# Patient Record
Sex: Female | Born: 1935 | Race: Asian | Marital: Single
Health system: Southern US, Community
[De-identification: ages and names within clinical notes are randomized; demographics above are authoritative.]

---

## 2014-05-06 ENCOUNTER — Inpatient Hospital Stay
Admit: 2014-05-06 | Disposition: A | Discharge status: Home / Self Care | Payer: Self-pay | Attending: Internal Medicine | Admitting: Internal Medicine

## 2014-05-06 DIAGNOSIS — I340 Nonrheumatic mitral (valve) insufficiency: Secondary | ICD-10-CM

## 2014-05-06 LAB — CBC
HCT: 28.2 % — AB (ref 35.0–47.0)
HGB: 8.6 g/dL — AB (ref 12.0–16.0)
MCH: 32.1 pg (ref 26.0–34.0)
MCHC: 30.3 g/dL — AB (ref 32.0–36.0)
MCV: 106 fL — ABNORMAL HIGH (ref 80–100)
Platelet: 129 10*3/uL — ABNORMAL LOW (ref 150–440)
RBC: 2.67 10*6/uL — ABNORMAL LOW (ref 3.80–5.20)
RDW: 17.7 % — ABNORMAL HIGH (ref 11.5–14.5)
WBC: 5.4 10*3/uL (ref 3.6–11.0)

## 2014-05-06 LAB — CK TOTAL AND CKMB (NOT AT ARMC)
CK, TOTAL: 99 U/L
CK-MB: 7.4 ng/mL — AB

## 2014-05-06 LAB — IRON AND TIBC
IRON SATURATION: 22.9
Iron Bind.Cap.(Total): 210 — ABNORMAL LOW (ref 250–450)
Iron: 48 ug/dL
Unbound Iron-Bind.Cap.: 161.7

## 2014-05-06 LAB — BASIC METABOLIC PANEL
ANION GAP: 4 — AB (ref 7–16)
BUN: 55 mg/dL — AB
CO2: 18 mmol/L — AB
Calcium, Total: 7.9 mg/dL — ABNORMAL LOW
Chloride: 119 mmol/L — ABNORMAL HIGH
Creatinine: 4.16 mg/dL — ABNORMAL HIGH
EGFR (Non-African Amer.): 9 — ABNORMAL LOW
GFR CALC AF AMER: 10 — AB
Glucose: 136 mg/dL — ABNORMAL HIGH
Potassium: 6.3 mmol/L — ABNORMAL HIGH
Sodium: 141 mmol/L

## 2014-05-06 LAB — PROTIME-INR
INR: 1.1
PROTHROMBIN TIME: 14.7 s

## 2014-05-06 LAB — PHOSPHORUS: Phosphorus: 4.3 mg/dL

## 2014-05-06 LAB — CK-MB: CK-MB: 8 ng/mL — AB

## 2014-05-06 LAB — FERRITIN: FERRITIN (ARMC): 79 ng/mL

## 2014-05-06 LAB — PRO B NATRIURETIC PEPTIDE: B-Type Natriuretic Peptide: 4269 pg/mL — ABNORMAL HIGH

## 2014-05-06 LAB — TROPONIN I
Troponin-I: 0.06 ng/mL — ABNORMAL HIGH
Troponin-I: 0.06 ng/mL — ABNORMAL HIGH

## 2014-05-06 LAB — POTASSIUM: POTASSIUM: 5.5 mmol/L — AB

## 2014-05-07 LAB — PROTEIN / CREATININE RATIO, URINE
Creatinine, Urine Random: 134 mg/dL (ref 30–125)
Protein, Urine: <6 mg/dL — ABNORMAL LOW (ref 0–9)

## 2014-05-07 LAB — HEPATIC FUNCTION PANEL A (ARMC)
ALK PHOS: 40 U/L
ALT: 9 U/L — AB
AST: 19 U/L
Albumin: 2.4 g/dL — ABNORMAL LOW
Bilirubin, Direct: <0.1 mg/dL
Bilirubin,Total: <0.1 mg/dL — ABNORMAL LOW
TOTAL PROTEIN: 5.6 g/dL — AB

## 2014-05-07 LAB — RENAL FUNCTION PANEL
ALBUMIN: 2.3 g/dL — AB
ANION GAP: 4 — AB (ref 7–16)
BUN: 43 mg/dL — ABNORMAL HIGH
CALCIUM: 7.7 mg/dL — AB
CHLORIDE: 117 mmol/L — AB
Co2: 22 mmol/L
Creatinine: 3.51 mg/dL — ABNORMAL HIGH
EGFR (Non-African Amer.): 11 — ABNORMAL LOW
GFR CALC AF AMER: 13 — AB
Glucose: 83 mg/dL
PHOSPHORUS: 4.2 mg/dL
POTASSIUM: 4.8 mmol/L
Sodium: 143 mmol/L

## 2014-05-07 LAB — URINALYSIS, COMPLETE
Bilirubin,UR: NEGATIVE
Blood: NEGATIVE
Glucose,UR: 50 mg/dL (ref 0–75)
KETONE: NEGATIVE
Leukocyte Esterase: NEGATIVE
Nitrite: NEGATIVE
Ph: 5 (ref 4.5–8.0)
Protein: >=500
SPECIFIC GRAVITY: 1.014 (ref 1.003–1.030)

## 2014-05-07 LAB — CBC WITH DIFFERENTIAL/PLATELET
BASOS PCT: 0.6 %
Basophil #: 0 10*3/uL (ref 0.0–0.1)
Eosinophil #: 0 10*3/uL (ref 0.0–0.7)
Eosinophil %: 0.3 %
HCT: 23.9 % — ABNORMAL LOW (ref 35.0–47.0)
HGB: 7.5 g/dL — AB (ref 12.0–16.0)
LYMPHS ABS: 1.5 10*3/uL (ref 1.0–3.6)
Lymphocyte %: 30.9 %
MCH: 32 pg (ref 26.0–34.0)
MCHC: 31.3 g/dL — AB (ref 32.0–36.0)
MCV: 102 fL — ABNORMAL HIGH (ref 80–100)
MONO ABS: 0.4 x10 3/mm (ref 0.2–0.9)
Monocyte %: 7.2 %
NEUTROS ABS: 3.1 10*3/uL (ref 1.4–6.5)
NEUTROS PCT: 61 %
Platelet: 76 10*3/uL — ABNORMAL LOW (ref 150–440)
RBC: 2.33 10*6/uL — ABNORMAL LOW (ref 3.80–5.20)
RDW: 17.6 % — ABNORMAL HIGH (ref 11.5–14.5)
WBC: 5 10*3/uL (ref 3.6–11.0)

## 2014-05-07 LAB — CK TOTAL AND CKMB (NOT AT ARMC)
CK, Total: 69 U/L
CK-MB: 7.9 ng/mL — AB

## 2014-05-07 LAB — PROTEIN ELECTROPHORESIS(ARMC)

## 2014-05-07 LAB — TROPONIN I: Troponin-I: 0.08 ng/mL — ABNORMAL HIGH

## 2014-05-07 LAB — KAPPA/LAMBDA FREE LIGHT CHAINS (ARMC)

## 2014-05-08 LAB — RENAL FUNCTION PANEL
ANION GAP: 4 — AB (ref 7–16)
Albumin: 2.3 g/dL — ABNORMAL LOW
BUN: 35 mg/dL — ABNORMAL HIGH
CALCIUM: 7.3 mg/dL — AB
Chloride: 107 mmol/L
Co2: 28 mmol/L
Creatinine: 3.4 mg/dL — ABNORMAL HIGH
GFR CALC AF AMER: 13 — AB
GFR CALC NON AF AMER: 12 — AB
Glucose: 135 mg/dL — ABNORMAL HIGH
Phosphorus: 3.3 mg/dL
Potassium: 4.8 mmol/L
SODIUM: 139 mmol/L

## 2014-05-08 LAB — URINE CULTURE

## 2014-05-08 LAB — CBC WITH DIFFERENTIAL/PLATELET
BASOS ABS: 0 10*3/uL (ref 0.0–0.1)
Basophil %: 0.6 %
EOS ABS: 0 10*3/uL (ref 0.0–0.7)
EOS PCT: 0.5 %
HCT: 20.2 % — AB (ref 35.0–47.0)
HGB: 6.3 g/dL — ABNORMAL LOW (ref 12.0–16.0)
Lymphocyte #: 1.1 10*3/uL (ref 1.0–3.6)
Lymphocyte %: 26 %
MCH: 31.9 pg (ref 26.0–34.0)
MCHC: 31 g/dL — ABNORMAL LOW (ref 32.0–36.0)
MCV: 103 fL — ABNORMAL HIGH (ref 80–100)
Monocyte #: 0.4 x10 3/mm (ref 0.2–0.9)
Monocyte %: 8.8 %
NEUTROS PCT: 64.1 %
Neutrophil #: 2.8 10*3/uL (ref 1.4–6.5)
PLATELETS: 54 10*3/uL — AB (ref 150–440)
RBC: 1.96 10*6/uL — ABNORMAL LOW (ref 3.80–5.20)
RDW: 16.7 % — AB (ref 11.5–14.5)
WBC: 4.4 10*3/uL (ref 3.6–11.0)

## 2014-05-09 LAB — CBC WITH DIFFERENTIAL/PLATELET
BASOS ABS: 0 10*3/uL (ref 0.0–0.1)
Basophil %: 0.7 %
EOS ABS: 0.1 10*3/uL (ref 0.0–0.7)
EOS PCT: 1.3 %
HCT: 20.3 % — AB (ref 35.0–47.0)
HGB: 6.5 g/dL — ABNORMAL LOW (ref 12.0–16.0)
LYMPHS ABS: 2.1 10*3/uL (ref 1.0–3.6)
Lymphocyte %: 37.8 %
MCH: 32.7 pg (ref 26.0–34.0)
MCHC: 32.1 g/dL (ref 32.0–36.0)
MCV: 102 fL — AB (ref 80–100)
MONO ABS: 0.6 x10 3/mm (ref 0.2–0.9)
Monocyte %: 11.1 %
Neutrophil #: 2.8 10*3/uL (ref 1.4–6.5)
Neutrophil %: 49.1 %
PLATELETS: 49 10*3/uL — AB (ref 150–440)
RBC: 1.99 10*6/uL — ABNORMAL LOW (ref 3.80–5.20)
RDW: 16.6 % — AB (ref 11.5–14.5)
WBC: 5.6 10*3/uL (ref 3.6–11.0)

## 2014-05-09 LAB — BASIC METABOLIC PANEL
ANION GAP: 3 — AB (ref 7–16)
BUN: 21 mg/dL — ABNORMAL HIGH
CHLORIDE: 104 mmol/L
CREATININE: 2.53 mg/dL — AB
Calcium, Total: 7.3 mg/dL — ABNORMAL LOW
Co2: 31 mmol/L
EGFR (African American): 19 — ABNORMAL LOW
GFR CALC NON AF AMER: 16 — AB
GLUCOSE: 90 mg/dL
POTASSIUM: 4.2 mmol/L
Sodium: 138 mmol/L

## 2014-05-10 ENCOUNTER — Other Ambulatory Visit: Payer: Self-pay | Admitting: Physician Assistant

## 2014-05-10 LAB — CBC WITH DIFFERENTIAL/PLATELET
BASOS PCT: 0.5 %
Basophil #: 0 10*3/uL (ref 0.0–0.1)
EOS PCT: 1.7 %
Eosinophil #: 0.1 10*3/uL (ref 0.0–0.7)
HCT: 25.9 % — ABNORMAL LOW (ref 35.0–47.0)
HGB: 8.2 g/dL — AB (ref 12.0–16.0)
Lymphocyte #: 1.5 10*3/uL (ref 1.0–3.6)
Lymphocyte %: 24.9 %
MCH: 31.6 pg (ref 26.0–34.0)
MCHC: 31.7 g/dL — ABNORMAL LOW (ref 32.0–36.0)
MCV: 99 fL (ref 80–100)
MONO ABS: 0.6 x10 3/mm (ref 0.2–0.9)
Monocyte %: 10.2 %
NEUTROS ABS: 3.7 10*3/uL (ref 1.4–6.5)
Neutrophil %: 62.7 %
Platelet: 59 10*3/uL — ABNORMAL LOW (ref 150–440)
RBC: 2.6 10*6/uL — AB (ref 3.80–5.20)
RDW: 18.3 % — ABNORMAL HIGH (ref 11.5–14.5)
WBC: 5.9 10*3/uL (ref 3.6–11.0)

## 2014-05-10 LAB — BASIC METABOLIC PANEL
Anion Gap: 3 — ABNORMAL LOW (ref 7–16)
BUN: 33 mg/dL — AB
CHLORIDE: 100 mmol/L — AB
CREATININE: 3.51 mg/dL — AB
Calcium, Total: 7.5 mg/dL — ABNORMAL LOW
Co2: 31 mmol/L
EGFR (African American): 13 — ABNORMAL LOW
EGFR (Non-African Amer.): 11 — ABNORMAL LOW
GLUCOSE: 97 mg/dL
Potassium: 4.3 mmol/L
SODIUM: 134 mmol/L — AB

## 2014-05-10 LAB — PHOSPHORUS: Phosphorus: 3.9 mg/dL

## 2014-05-11 DIAGNOSIS — I10 Essential (primary) hypertension: Secondary | ICD-10-CM

## 2014-05-11 DIAGNOSIS — I5043 Acute on chronic combined systolic (congestive) and diastolic (congestive) heart failure: Secondary | ICD-10-CM

## 2014-05-11 DIAGNOSIS — N186 End stage renal disease: Secondary | ICD-10-CM

## 2014-05-11 LAB — BASIC METABOLIC PANEL
ANION GAP: 3 — AB (ref 7–16)
BUN: 23 mg/dL — ABNORMAL HIGH
CALCIUM: 7.2 mg/dL — AB
CHLORIDE: 100 mmol/L — AB
Co2: 33 mmol/L — ABNORMAL HIGH
Creatinine: 2.71 mg/dL — ABNORMAL HIGH
EGFR (Non-African Amer.): 15 — ABNORMAL LOW
GFR CALC AF AMER: 18 — AB
Glucose: 97 mg/dL
Potassium: 4.2 mmol/L
Sodium: 136 mmol/L

## 2014-05-11 LAB — CBC WITH DIFFERENTIAL/PLATELET
Basophil #: 0 10*3/uL (ref 0.0–0.1)
Basophil %: 0.6 %
EOS PCT: 0.4 %
Eosinophil #: 0 10*3/uL (ref 0.0–0.7)
HCT: 23.7 % — ABNORMAL LOW (ref 35.0–47.0)
HGB: 7.6 g/dL — ABNORMAL LOW (ref 12.0–16.0)
LYMPHS ABS: 1.6 10*3/uL (ref 1.0–3.6)
Lymphocyte %: 22.6 %
MCH: 31.6 pg (ref 26.0–34.0)
MCHC: 32.2 g/dL (ref 32.0–36.0)
MCV: 98 fL (ref 80–100)
MONO ABS: 0.8 x10 3/mm (ref 0.2–0.9)
Monocyte %: 11.1 %
NEUTROS PCT: 65.3 %
Neutrophil #: 4.7 10*3/uL (ref 1.4–6.5)
Platelet: 56 10*3/uL — ABNORMAL LOW (ref 150–440)
RBC: 2.41 10*6/uL — AB (ref 3.80–5.20)
RDW: 17.4 % — ABNORMAL HIGH (ref 11.5–14.5)
WBC: 7.2 10*3/uL (ref 3.6–11.0)

## 2014-05-11 LAB — PROTEIN, BODY FLUID

## 2014-05-11 LAB — AMYLASE, BODY FLUID: Amylase, Body Fluid: 42 U/L

## 2014-05-11 LAB — UR PROT ELECTROPHORESIS, URINE RANDOM

## 2014-05-11 LAB — GLUCOSE, SEROUS FLUID: Glucose, Body Fluid: 109 mg/dL

## 2014-05-11 LAB — ALBUMIN, FLUID (OTHER): BODY FLUID ALBUMIN: 1 g/dL

## 2014-05-11 LAB — LACTATE DEHYDROGENASE, PLEURAL OR PERITONEAL FLUID: LDH, Body Fluid: 58 U/L

## 2014-05-12 LAB — BODY FLUID CELL COUNT WITH DIFFERENTIAL
Basophil: 0 %
EOS PCT: 0 %
Lymphocytes: 17 %
NUCLEATED CELL COUNT: 79 /mm3
Neutrophils: 79 %
OTHER MONONUCLEAR CELLS: 4 %
Other Cells BF: 0 %

## 2014-05-12 LAB — BASIC METABOLIC PANEL
Anion Gap: 3 — ABNORMAL LOW (ref 7–16)
BUN: 30 mg/dL — ABNORMAL HIGH
CALCIUM: 7.3 mg/dL — AB
CHLORIDE: 100 mmol/L — AB
Co2: 31 mmol/L
Creatinine: 3.56 mg/dL — ABNORMAL HIGH
EGFR (African American): 13 — ABNORMAL LOW
GFR CALC NON AF AMER: 11 — AB
Glucose: 97 mg/dL
Potassium: 4.5 mmol/L
SODIUM: 134 mmol/L — AB

## 2014-05-12 LAB — CBC WITH DIFFERENTIAL/PLATELET
Basophil #: 0 10*3/uL (ref 0.0–0.1)
Basophil %: 0.4 %
EOS ABS: 0.1 10*3/uL (ref 0.0–0.7)
Eosinophil %: 1.3 %
HCT: 25.1 % — AB (ref 35.0–47.0)
HGB: 8 g/dL — ABNORMAL LOW (ref 12.0–16.0)
LYMPHS PCT: 20.3 %
Lymphocyte #: 1.3 10*3/uL (ref 1.0–3.6)
MCH: 32 pg (ref 26.0–34.0)
MCHC: 31.9 g/dL — AB (ref 32.0–36.0)
MCV: 100 fL (ref 80–100)
MONO ABS: 0.8 x10 3/mm (ref 0.2–0.9)
Monocyte %: 12.5 %
NEUTROS ABS: 4.1 10*3/uL (ref 1.4–6.5)
Neutrophil %: 65.5 %
Platelet: 64 10*3/uL — ABNORMAL LOW (ref 150–440)
RBC: 2.5 10*6/uL — ABNORMAL LOW (ref 3.80–5.20)
RDW: 17.1 % — AB (ref 11.5–14.5)
WBC: 6.2 10*3/uL (ref 3.6–11.0)

## 2014-05-12 LAB — PROTEIN, BODY FLUID

## 2014-05-12 LAB — LACTATE DEHYDROGENASE, PLEURAL OR PERITONEAL FLUID: LDH, Body Fluid: 118 U/L

## 2014-05-12 LAB — PHOSPHORUS: Phosphorus: 3.7 mg/dL

## 2014-05-13 LAB — BASIC METABOLIC PANEL
ANION GAP: 3 — AB (ref 7–16)
BUN: 17 mg/dL
CALCIUM: 7.4 mg/dL — AB
CHLORIDE: 101 mmol/L
Co2: 34 mmol/L — ABNORMAL HIGH
Creatinine: 2.26 mg/dL — ABNORMAL HIGH
EGFR (African American): 22 — ABNORMAL LOW
EGFR (Non-African Amer.): 19 — ABNORMAL LOW
Glucose: 95 mg/dL
Potassium: 4.1 mmol/L
Sodium: 138 mmol/L

## 2014-05-13 LAB — CBC WITH DIFFERENTIAL/PLATELET
BASOS PCT: 0.4 %
Basophil #: 0 10*3/uL (ref 0.0–0.1)
EOS ABS: 0.1 10*3/uL (ref 0.0–0.7)
Eosinophil %: 2.2 %
HCT: 24.6 % — ABNORMAL LOW (ref 35.0–47.0)
HGB: 7.8 g/dL — ABNORMAL LOW (ref 12.0–16.0)
Lymphocyte #: 1.1 10*3/uL (ref 1.0–3.6)
Lymphocyte %: 20.4 %
MCH: 31.9 pg (ref 26.0–34.0)
MCHC: 31.7 g/dL — ABNORMAL LOW (ref 32.0–36.0)
MCV: 101 fL — AB (ref 80–100)
Monocyte #: 0.8 x10 3/mm (ref 0.2–0.9)
Monocyte %: 13.9 %
NEUTROS ABS: 3.5 10*3/uL (ref 1.4–6.5)
Neutrophil %: 63.1 %
PLATELETS: 59 10*3/uL — AB (ref 150–440)
RBC: 2.45 10*6/uL — AB (ref 3.80–5.20)
RDW: 17.6 % — AB (ref 11.5–14.5)
WBC: 5.6 10*3/uL (ref 3.6–11.0)

## 2014-05-14 LAB — PHOSPHORUS: Phosphorus: 2.6 mg/dL

## 2014-05-15 LAB — BASIC METABOLIC PANEL
Anion Gap: 0 — ABNORMAL LOW (ref 7–16)
BUN: 17 mg/dL
CALCIUM: 7.3 mg/dL — AB
CO2: 35 mmol/L — AB
CREATININE: 2.44 mg/dL — AB
Chloride: 102 mmol/L
EGFR (African American): 20 — ABNORMAL LOW
EGFR (Non-African Amer.): 17 — ABNORMAL LOW
Glucose: 91 mg/dL
Potassium: 4.3 mmol/L
Sodium: 137 mmol/L

## 2014-05-15 LAB — CBC WITH DIFFERENTIAL/PLATELET
BASOS ABS: 0 10*3/uL (ref 0.0–0.1)
Basophil %: 0.6 %
Eosinophil #: 0.1 10*3/uL (ref 0.0–0.7)
Eosinophil %: 2 %
HCT: 25.4 % — ABNORMAL LOW (ref 35.0–47.0)
HGB: 8 g/dL — ABNORMAL LOW (ref 12.0–16.0)
LYMPHS PCT: 28.7 %
Lymphocyte #: 2 10*3/uL (ref 1.0–3.6)
MCH: 32.2 pg (ref 26.0–34.0)
MCHC: 31.6 g/dL — ABNORMAL LOW (ref 32.0–36.0)
MCV: 102 fL — ABNORMAL HIGH (ref 80–100)
MONOS PCT: 15 %
Monocyte #: 1 x10 3/mm — ABNORMAL HIGH (ref 0.2–0.9)
NEUTROS PCT: 53.7 %
Neutrophil #: 3.8 10*3/uL (ref 1.4–6.5)
PLATELETS: 58 10*3/uL — AB (ref 150–440)
RBC: 2.5 10*6/uL — ABNORMAL LOW (ref 3.80–5.20)
RDW: 17.2 % — AB (ref 11.5–14.5)
WBC: 7 10*3/uL (ref 3.6–11.0)

## 2014-05-15 LAB — MAGNESIUM: MAGNESIUM: 1.6 mg/dL — AB

## 2014-05-16 LAB — BODY FLUID CULTURE

## 2014-05-17 LAB — RENAL FUNCTION PANEL
ALBUMIN: 2.3 g/dL — AB
Anion Gap: 6 — ABNORMAL LOW (ref 7–16)
BUN: 39 mg/dL — ABNORMAL HIGH
CHLORIDE: 95 mmol/L — AB
CREATININE: 3.73 mg/dL — AB
Calcium, Total: 7.4 mg/dL — ABNORMAL LOW
Co2: 34 mmol/L — ABNORMAL HIGH
EGFR (African American): 12 — ABNORMAL LOW
GFR CALC NON AF AMER: 10 — AB
Glucose: 128 mg/dL — ABNORMAL HIGH
POTASSIUM: 4.9 mmol/L
Phosphorus: 3 mg/dL
Sodium: 135 mmol/L

## 2014-05-17 LAB — CBC WITH DIFFERENTIAL/PLATELET
BASOS ABS: 0 10*3/uL (ref 0.0–0.1)
Basophil %: 0.6 %
EOS ABS: 0.2 10*3/uL (ref 0.0–0.7)
Eosinophil %: 2.3 %
HCT: 24.1 % — ABNORMAL LOW (ref 35.0–47.0)
HGB: 7.8 g/dL — AB (ref 12.0–16.0)
LYMPHS ABS: 1.3 10*3/uL (ref 1.0–3.6)
Lymphocyte %: 18.3 %
MCH: 32.4 pg (ref 26.0–34.0)
MCHC: 32.2 g/dL (ref 32.0–36.0)
MCV: 101 fL — AB (ref 80–100)
MONO ABS: 1.1 x10 3/mm — AB (ref 0.2–0.9)
Monocyte %: 14.9 %
Neutrophil #: 4.6 10*3/uL (ref 1.4–6.5)
Neutrophil %: 63.9 %
Platelet: 95 10*3/uL — ABNORMAL LOW (ref 150–440)
RBC: 2.4 10*6/uL — ABNORMAL LOW (ref 3.80–5.20)
RDW: 17.1 % — AB (ref 11.5–14.5)
WBC: 7.2 10*3/uL (ref 3.6–11.0)

## 2014-05-19 LAB — RENAL FUNCTION PANEL
ALBUMIN: 2.3 g/dL — AB
Anion Gap: 2 — ABNORMAL LOW (ref 7–16)
BUN: 41 mg/dL — AB
CHLORIDE: 99 mmol/L — AB
CREATININE: 3.32 mg/dL — AB
Calcium, Total: 7.3 mg/dL — ABNORMAL LOW
Co2: 34 mmol/L — ABNORMAL HIGH
EGFR (African American): 14 — ABNORMAL LOW
GFR CALC NON AF AMER: 12 — AB
Glucose: 94 mg/dL
POTASSIUM: 4.5 mmol/L
Phosphorus: 2.2 mg/dL — ABNORMAL LOW
Sodium: 135 mmol/L

## 2014-05-19 LAB — HEMOGLOBIN: HGB: 7.3 g/dL — AB (ref 12.0–16.0)

## 2014-05-19 LAB — MAGNESIUM: Magnesium: 1.7 mg/dL

## 2014-05-20 LAB — HEMOGLOBIN: HGB: 7.8 g/dL — ABNORMAL LOW (ref 12.0–16.0)

## 2014-05-20 LAB — CREATININE CLEARANCE, URINE, 24 HOUR
COLLECTION HOURS: 24 h
CREATININE, URINE: 27 mg/dL — AB (ref 30.0–125.0)
Creatinine Clearance: 4 mL/min — ABNORMAL LOW (ref 80–130)
Creatinine, Serum: 4.04 mg/dL — ABNORMAL HIGH (ref 0.60–1.30)
Total Volume: 700 mL

## 2014-05-20 LAB — BASIC METABOLIC PANEL
Anion Gap: 6 — ABNORMAL LOW (ref 7–16)
BUN: 52 mg/dL — ABNORMAL HIGH
CHLORIDE: 96 mmol/L — AB
Calcium, Total: 7.7 mg/dL — ABNORMAL LOW
Co2: 33 mmol/L — ABNORMAL HIGH
Creatinine: 4.04 mg/dL — ABNORMAL HIGH
EGFR (African American): 11 — ABNORMAL LOW
EGFR (Non-African Amer.): 9 — ABNORMAL LOW
Glucose: 89 mg/dL
Potassium: 4.8 mmol/L
Sodium: 135 mmol/L

## 2014-05-22 LAB — RENAL FUNCTION PANEL
ALBUMIN: 2.6 g/dL — AB
ANION GAP: 6 — AB (ref 7–16)
BUN: 75 mg/dL — ABNORMAL HIGH
CALCIUM: 7.7 mg/dL — AB
CO2: 31 mmol/L
Chloride: 94 mmol/L — ABNORMAL LOW
Creatinine: 4.94 mg/dL — ABNORMAL HIGH
EGFR (African American): 9 — ABNORMAL LOW
EGFR (Non-African Amer.): 7 — ABNORMAL LOW
Glucose: 147 mg/dL — ABNORMAL HIGH
Phosphorus: 3.5 mg/dL
Potassium: 4.7 mmol/L
Sodium: 131 mmol/L — ABNORMAL LOW

## 2014-05-22 LAB — CBC WITH DIFFERENTIAL/PLATELET
BASOS ABS: 0 10*3/uL (ref 0.0–0.1)
BASOS PCT: 0.6 %
Eosinophil #: 0.2 10*3/uL (ref 0.0–0.7)
Eosinophil %: 2.9 %
HCT: 24.3 % — ABNORMAL LOW (ref 35.0–47.0)
HGB: 7.7 g/dL — ABNORMAL LOW (ref 12.0–16.0)
Lymphocyte #: 1.4 10*3/uL (ref 1.0–3.6)
Lymphocyte %: 19.6 %
MCH: 31.8 pg (ref 26.0–34.0)
MCHC: 31.8 g/dL — AB (ref 32.0–36.0)
MCV: 100 fL (ref 80–100)
Monocyte #: 0.7 x10 3/mm (ref 0.2–0.9)
Monocyte %: 10 %
NEUTROS PCT: 66.9 %
Neutrophil #: 4.8 10*3/uL (ref 1.4–6.5)
Platelet: 145 10*3/uL — ABNORMAL LOW (ref 150–440)
RBC: 2.43 10*6/uL — ABNORMAL LOW (ref 3.80–5.20)
RDW: 16.3 % — ABNORMAL HIGH (ref 11.5–14.5)
WBC: 7.1 10*3/uL (ref 3.6–11.0)

## 2014-05-25 LAB — PHOSPHORUS: Phosphorus: 6.2 mg/dL — ABNORMAL HIGH

## 2014-05-27 LAB — CBC WITH DIFFERENTIAL/PLATELET
BASOS ABS: 0.1 10*3/uL (ref 0.0–0.1)
Basophil %: 1 %
EOS ABS: 0.1 10*3/uL (ref 0.0–0.7)
Eosinophil %: 2 %
HCT: 21.7 % — ABNORMAL LOW (ref 35.0–47.0)
HGB: 7 g/dL — ABNORMAL LOW (ref 12.0–16.0)
LYMPHS PCT: 19.9 %
Lymphocyte #: 1.4 10*3/uL (ref 1.0–3.6)
MCH: 32.5 pg (ref 26.0–34.0)
MCHC: 32 g/dL (ref 32.0–36.0)
MCV: 102 fL — ABNORMAL HIGH (ref 80–100)
MONOS PCT: 11.7 %
Monocyte #: 0.8 x10 3/mm (ref 0.2–0.9)
NEUTROS PCT: 65.4 %
Neutrophil #: 4.6 10*3/uL (ref 1.4–6.5)
Platelet: 165 10*3/uL (ref 150–440)
RBC: 2.14 10*6/uL — AB (ref 3.80–5.20)
RDW: 16.3 % — ABNORMAL HIGH (ref 11.5–14.5)
WBC: 7.1 10*3/uL (ref 3.6–11.0)

## 2014-05-27 LAB — PHOSPHORUS: PHOSPHORUS: 3.8 mg/dL

## 2014-05-28 LAB — CBC WITH DIFFERENTIAL/PLATELET
Basophil #: 0.1 10*3/uL (ref 0.0–0.1)
Basophil %: 1.4 %
EOS PCT: 1.6 %
Eosinophil #: 0.1 10*3/uL (ref 0.0–0.7)
HCT: 22.5 % — ABNORMAL LOW (ref 35.0–47.0)
HGB: 7 g/dL — AB (ref 12.0–16.0)
LYMPHS ABS: 1.7 10*3/uL (ref 1.0–3.6)
Lymphocyte %: 26.6 %
MCH: 31.6 pg (ref 26.0–34.0)
MCHC: 31.2 g/dL — ABNORMAL LOW (ref 32.0–36.0)
MCV: 101 fL — AB (ref 80–100)
MONO ABS: 0.8 x10 3/mm (ref 0.2–0.9)
Monocyte %: 12.1 %
NEUTROS PCT: 58.3 %
Neutrophil #: 3.8 10*3/uL (ref 1.4–6.5)
Platelet: 136 10*3/uL — ABNORMAL LOW (ref 150–440)
RBC: 2.22 10*6/uL — AB (ref 3.80–5.20)
RDW: 15.8 % — ABNORMAL HIGH (ref 11.5–14.5)
WBC: 6.6 10*3/uL (ref 3.6–11.0)

## 2014-05-30 NOTE — Consult Note (Signed)
Brief Consult Note: Diagnosis: acute renal failure.   Patient was seen by consultant.   Recommend to proceed with surgery or procedure.   Orders entered.   Discussed with Attending MD.   Comments: will place right IJ temp dialysis cath with ultrasound given the additional finding of lack of appropriate iv access a trialysis cath will be used.  Electronic Signatures: Levora DredgeSchnier, Karime Scheuermann (MD)  (Signed 07-Apr-16 21:08)  Authored: Brief Consult Note   Last Updated: 07-Apr-16 21:08 by Levora DredgeSchnier, Kingston Shawgo (MD)

## 2014-05-30 NOTE — H&P (Signed)
PATIENT NAME:  Carrie Hughes, Carrie Hughes MR#:  [id number] DATE OF BIRTH:  07-Aug-1924  DATE OF ADMISSION:  05/06/2014  PRIMARY CARE PHYSICIAN: None.   HISTORY OF PRESENT ILLNESS:  This is a pleasant 81 year old female [place] who presents with increasing shortness of breath, dyspnea on exertion, and anasarca. Her symptoms have been persisting for the past 30 days; however, worsened over the past few days.  Family was concerned so brought her to the ER for further evaluation. In the ER, she is noted to have acute renal failure, mild pulmonary edema on a chest x-ray, and hyperkalemia.  REVIEW OF SYSTEMS: CONSTITUTIONAL: No fever, positive fatigue, weakness.  EYES:  There is no blurred or double vision.  EARS, NOSE, AND THROAT: Positive hearing loss. denies postnasal drip. RESPIRATORY: No cough, wheezing, hemoptysis. Positive dyspnea.  CARDIOVASCULAR: No chest pain, orthopnea, positive edema. No arrhythmia. Positive dyspnea on exertion. GASTROINTESTINAL: Positive nausea. No vomiting, diarrhea, abdominal pain, melena, or ulcers.  GENITOURINARY:  No dysuria, hematuria, decreased urine output, frequency, urgency.  ENDOCRINE: No polyuria or polydipsia.  HEMATOLOGIC AND LYMPHATIC:  No anemia, easy bruising.  SKIN: No rash or lesions.  MUSCULOSKELETAL: Positive limited activity due to the swelling in her legs.  NEUROLOGIC: No history of CVA, TIA or seizure.  PSYCHIATRIC: No history of anxiety or depression.   PAST MEDICAL HISTORY:  None.  MEDICATIONS: None.   ALLERGIES: No known drug allergies.   PAST SURGICAL HISTORY: None.   SOCIAL HISTORY:  No tobacco, alcohol, or drug use.   FAMILY HISTORY: Unknown.   PHYSICAL EXAMINATION:  VITAL SIGNS: Temperature is 98.3, pulse 67, respirations 18, blood pressure 194/82, 96% on room air.  GENERAL: The patient is alert, oriented, not in any acute distress.  HEENT: Head is atraumatic. Pupils are round and reactive. Sclerae anicteric.  Mucous membranes are moist.  OROPHARYNX: Clear.  NECK: Supple. No JVD, carotid bruits, or enlarged pharynx. CARDIOVASCULAR: Regular rate and rhythm. There is a 2/6 systolic ejection murmur heard best at the left sternal border. PMI is not displaced. LUNGS: She has crackles at the bases. No egophony, no dullness to percussion. No rhonchi or wheezing.  ABDOMEN:  Bowel sounds positive. Nontender. She does have a fluid wave.  EXTREMITIES: She has 3+ pitting edema all the way up to her abdomen.  Her left upper extremity is also swollen.  NEUROLOGIC:  Cranial nerves II through XII are intact. No focal deficits.  SKIN:  Without rash or lesions.   LABORATORY DATA: White blood cells 5.4, hemoglobin 8.6, hematocrit 29, platelets 129,000.  Sodium 141, potassium 6.3, chloride 119, bicarbonate 18, BUN 55, creatinine 4.16. Glucose is 132, calcium 7.9.  CK is 99, CPK-MB 7.4, troponin 0.06.  Chest x-ray shows bilateral pleural effusions and atelectasis, left greater than the right.   EKG:  Sinus rhythm with PACs.   ASSESSMENT AND PLAN: This is an 81 year old [place]-speaking female who [place]  Presents with anasarca and found to have acute renal failure, hyperkalemia, elevated troponin, accelerated hypertension, and pulmonary edema on chest x-ray.  1.  Acute renal failure, symptomatic.  The patient presents with hyperkalemia, anasarca, pulmonary edema on chest x-ray.  Etiology of acute renal failure at this time is unclear.  We will order a renal ultrasound.  She also has asymmetric upper extremity edema, so we may consider imaging her chest as well, but I have spoken with the nephrologist Dr. Wynelle LinkKolluru who will come and see the patient in consultation.  The patient will need dialysis due to her symptoms of acute renal failure. In the meantime, I am going to try Lasix 40 IV every 12 hours.  Monitor Is and Os and daily weights.  Order a renal ultrasound and echocardiogram.  All other laboratories  will ordered by the nephrologist as indicated.  2.  Pulmonary edema.  I suspect this is from fluid overload from her acute renal failure which needs to be addressed likely by dialysis. Will try Lasix to see if this helps. 3.  Accelerated hypertension likely in part due to her acute renal failure.  I have written for hydralazine.  We need to avoid any nephrotoxic medications and will follow the patient closely.  4.  Hyperkalemia. Will treat with Kayexalate, insulin, and D50.  Hyperkalemia is from for her acute renal failure which may be addressed with dialysis. Will repeat the potassium later on after giving medications.  5.  Elevated troponin, likely demand ischemia. Will continue to cycle her troponins.  Will also order echocardiogram for further work-up.  6.  Anemia likely from chronic disease. Will check iron panel.  Further work-up after we get the results back from her iron studies. Will also include B12 to this iron study.  The patient is a DO NOT RESUSCITATE status. The patient will need interpreter.   TIME SPENT: Approximately 50 minutes.   ____________________________ Janyth Contes. Juliene Pina, MD spm:sp D: 05/06/2014 15:14:05 ET T: 05/06/2014 15:47:55 ET JOB#: [id number]  cc: Armanda Forand P. Juliene Pina, MD, <Dictator> Janyth Contes Ardene Remley MD ELECTRONICALLY SIGNED 05/06/2014 17:28

## 2014-05-30 NOTE — Op Note (Signed)
PATIENT NAME:  Carrie Hughes, Carrie Hughes MR#:  [id number] DATE OF BIRTH:  01-Oct-1924  DATE OF PROCEDURE:  05/24/2014  PREOPERATIVE DIAGNOSIS: End-stage renal disease.   POSTOPERATIVE DIAGNOSIS:  End-stage renal disease.   PROCEDURES: 1.  Ultrasound guidance for vascular access to left internal jugular vein.  2.  Fluoroscopic guidance for placement of catheter.  3. Placement of a 19 cm tip-to-cuff tunneled hemodialysis catheter via the left internal jugular vein.   SURGEON:  Annice NeedyJason S. Dew, MD    ANESTHESIA: Local with sedation.   BLOOD LOSS: 25 mL.   INDICATION FOR PROCEDURE: An 81 year old Asian female who was admitted to the hospital a little over a week ago. She had a dialysis catheter placed for temporary dialysis, however, her creatinine clearance has come back very minimal and she is now felt to be end-stage renal disease. She needs a tunneled dialysis catheter for long term dialysis access as an outpatient. Risks and benefits were discussed. Informed consent was obtained.   DESCRIPTION OF THE PROCEDURE: The patient was brought to the vascular and interventional radiology suite. The patient's left neck and chest were sterilely prepped and draped and a sterile surgical field was created. The left internal jugular vein was visualized with ultrasound and found to be patent. It was then accessed under direct ultrasound guidance and a permanent image was recorded. A wire was placed. After a skin nick and dilatation, the peel-away sheath was placed over the wire.   I then turned my attention to an area under the clavicle. Approximately 2 fingerbreadths below the clavicle a small counter incision was created and we tunneled from the subclavicular incision to the access site. Using fluoroscopic guidance, a 19 cm tip-to-cuff tunneled hemodialysis catheter was selected, tunneled from the subclavicular incision to the access site. It was then placed through the peel-away sheath and the peel-away sheath was removed.  The catheter tips were parked in the right atrium. The appropriate distal connectors were placed. It withdrew blood well and flushed easily with heparinized saline and a concentrated heparin solution was then placed. It was secured to the chest wall with 2 Prolene sutures. The access incision was closed with a single 4-0 Monocryl. A 4-0 Monocryl pursestring suture was placed around the exit site. Sterile dressings were placed.   The patient tolerated the procedure well and was taken to the recovery room in stable condition.    ____________________________ Annice NeedyJason S. Dew, MD jsd:AT D: 05/24/2014 16:21:21 ET T: 05/24/2014 23:29:06 ET JOB#: [id number]  cc: Annice NeedyJason S. Dew, MD, <Dictator> Annice NeedyJASON S DEW MD ELECTRONICALLY SIGNED 05/26/2014 13:55

## 2014-05-30 NOTE — Consult Note (Signed)
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 Cardiology is consulted for further evaluation of CHF and pulmonary HTN.  __________________   PMH:  1. CKD per daughter ________________   Present Illness 81 year old female with the above problem list who presented to [facility] on 4/7 while she is here in the [place] visiting family with a 30-day history of increased dyspnea. She has no previously known cardiac history and has never seen a cardiologist before.   She presented with a 30-day history of increased dyspnea. Uncertain of baseline weight.  Some orthopnea. No chest pain, palpitations, nausea, vomiting, presyncope, or syncope. She does see a doctor in [place], it is uncertain if this [place] or [place]. Upon her arrival she was found to have acute renal failure with SCr of 4.16 (no priors)-->3.51 at time of consult. pBNP 4269. K+ 6.3. Troponin 0.06-->0.06-->0.08. CXR showed bilateral pleural effusions with minimal pulmonary edema. Echo showed EF 40-45%, moderate sized left pleural effusion, select images suggestive of anterior and anteroseptal wall HK, diastolic dysfunction, moderate LVH, mild to moderate MR, mild aortic stenosis, mild to moderate AI, mild TR, moderately elevated PASP at 53.4 mm Hg, small pericardial effusion without hemodynamic compromise. CXR on 4/11 showed slight increase in now moderate partly loculated right pleural effusion. Stable large left pleural effusion.Renal US showed atrophic kidneys. SPEP/ANCA/GBMs negative, ANA positive with mildly positive DS DNA titer. Dialysis catheter was placed on 4/7. She underwent emergent dialysis on the evening of 4/7.  She underwent HD again on 4/8, 4/9, 4/11. Exact etiology of renal failure uncertain. She does not have permanent status in the [place] and will be traveling back to [place] upon her discharge.   She was started on Lopressor 12.5 mg bid, however this was discontinued 4/11 secondary to bradycardia with HR briefly into the 30s-40, now in the 70s.  She continues to be on IV Lasix 40 mg q 12 hours, Imdur 60 mg daily, hydralazine 25 mg q 8 hours, and HD. Platelet count has continued to drop, upon admission platelet count was foundto be 126--> 56 currently. IM is attempting to schedule thoracentesis today of the above bilateral pleural effusions given the size and the loculated nature, though a page was put out to radiology given her platelet count to discuss this and IM is currently awaiting thier call back. Patient is currently sleeping in her rom.   Physical Exam:  GEN well developed, thin, critically ill appearing, frail   HEENT PERRL, hearing intact to voice, dry oral mucosa   NECK supple  JVD to mid neck   RESP normal resp effort  crackles   CARD Regular rate and rhythm  Murmur   Murmur Systolic  2/6   ABD denies tenderness  soft   LYMPH negative neck   EXTR negative edema   SKIN normal to palpation   NEURO cranial nerves intact   PSYCH alert, poor insight, lethargic   Review of Systems:  Subjective/Chief Complaint SOB yesterday   ROS Pt not able to provide ROS  lethargy   Medications/Allergies Reviewed Medications/Allergies reviewed   Family & Social History:  Family and Social History:  Family History Negative  unknown  2/2 lethargy   Social History negative tobacco, negative ETOH, negative Illicit drugs   Place of Living Home  [place]          Admit Diagnosis:   ACUTE RENAL FAILURE, UNSPECIFIED ACUTE R: Onset Date: 11-May-2014, Status: Active, Description: ACUTE RENAL FAILURE, UNSPECIFIED ACUTE R  Lab Results:  Hepatic:  08-Apr-16 04:50   Albumin, Serum   2.3 (3.5-5.0 NOTE: New reference range  04/06/14)  Routine Chem:  07-Apr-16 13:01   BUN  55 (6-20 NOTE: New Reference Range  04/06/14)  Creatinine (comp)  4.16 (0.44-1.00 NOTE: New Reference Range  04/06/14)  Potassium, Serum  6.3 (3.5-5.1 NOTE: New Reference Range  04/06/14)    18:40   Potassium, Serum  5.5 (3.5-5.1 NOTE: New Reference Range  04/06/14)  12-Apr-16 04:49   Glucose, Serum 97 (65-99 NOTE: New Reference Range  04/06/14)  BUN  23 (6-20 NOTE: New Reference Range  04/06/14)  Creatinine (comp)  2.71 (0.44-1.00 NOTE: New Reference Range  04/06/14)  Sodium, Serum 136 (135-145 NOTE: New Reference Range  04/06/14)  Potassium, Serum 4.2 (3.5-5.1 NOTE: New Reference Range  04/06/14)  Chloride, Serum  100 (101-111 NOTE: New Reference Range  04/06/14)  CO2, Serum  33 (22-32 NOTE: New Reference Range  04/06/14)  Calcium (Total), Serum  7.2 (8.9-10.3 NOTE: New Reference Range  04/06/14)  Anion Gap  3  eGFR (African American)  18  eGFR (Non-African American)  15 (eGFR values <45m/min/1.73 m2 may be an indication of chronic kidney disease (CKD). Calculated eGFR is useful in patients with stable renal function. The eGFR calculation will not be reliable in acutely ill patients when serum creatinine is changing rapidly. It is not useful in patients on dialysis. The eGFR calculation may not be applicable to patients at the low and high extremes of body sizes, pregnant women, and vegetarians.)  Routine Hem:  07-Apr-16 13:01   WBC (CBC) 5.4  Hemoglobin (CBC)  8.6  Hematocrit (CBC)  28.2  Platelet Count (CBC)  129 (Result(s) reported on 06 May 2014 at 01:42PM.)  12-Apr-16 04:49   WBC (CBC) 7.2  RBC (CBC)  2.41  Hemoglobin (CBC)  7.6  Hematocrit (CBC)  23.7  Platelet Count (CBC)  56  MCV 98  MCH 31.6  MCHC 32.2  RDW  17.4  Neutrophil % 65.3  Lymphocyte % 22.6  Monocyte % 11.1  Eosinophil % 0.4  Basophil % 0.6  Neutrophil # 4.7  Lymphocyte # 1.6   Monocyte # 0.8  Eosinophil # 0.0  Basophil # 0.0 (Result(s) reported on 11 May 2014 at 05:24AM.)   EKG:  EKG Interp. by me   Interpretation EKG shows NSR, 68 bpm, LBBB   Radiology Results:  XRay:    07-Apr-16 20:17, Chest Portable Single View  Chest Portable Single View   REASON FOR EXAM:    temp dialysis cath placement  COMMENTS:       PROCEDURE: DXR - DXR PORTABLE CHEST SINGLE VIEW  - May 06 2014  8:17PM     CLINICAL DATA:  Status post temporary dialysis catheter placement    EXAM:  PORTABLE CHEST - 1 VIEW    COMPARISON:  05/06/2014    FINDINGS:  Cardiac shadow remains enlarged. A large left-sided pleural effusion  and small right-sided pleural effusion are again. A new temporary  dialysis catheter is noted with the catheter tip at cavoatrial  junction. No pneumothorax is seen. The lungs are well aerated  similar to that seen on prior  exam.     IMPRESSION:  No evidence of pneumothorax following central line placement.      Electronically Signed    By: Inez Catalina M.D.    On: 05/06/2014 20:53         Verified By: Everlene Farrier, M.D.,    11-Apr-16 18:05, Chest Portable Single View  Chest Portable Single View   REASON FOR EXAM:    SOB  COMMENTS:       PROCEDURE: DXR - DXR PORTABLE CHEST SINGLE VIEW  - May 10 2014  6:05PM     CLINICAL DATA:  Increased shortness of breath    EXAM:  PORTABLE CHEST - 1 VIEW    COMPARISON:  05/06/2014    FINDINGS:  Slight increase in moderate-sized partly loculated right pleural  effusion is noted. Stable large left pleural effusion. Marked  enlargement of the cardiomediastinal silhouette is reidentified.  Lung apices are clear. Lung bases are obscured. Right IJ dialysis  catheter tip terminates at the cavoatrial junction.     IMPRESSION:  Slight increase in now moderate partly loculated right pleural  effusion.    Stable large left pleural effusion.      Electronically Signed    By: Conchita Paris M.D.    On:  05/10/2014 18:52     Verified By: Arline Asp, M.D.,  [place]:    08-Apr-16 16:25, US Kidney Bilateral  US Kidney Bilateral   REASON FOR EXAM:    arf  COMMENTS:       PROCEDURE: [place]  - US KIDNEY  - May 07 2014  4:25PM     CLINICAL DATA:  Acute renal failure.  Dialysis this morning.    EXAM:  RENAL/URINARY TRACT ULTRASOUND COMPLETE    COMPARISON:  None.    FINDINGS:  Right Kidney:  Length: 7.6 cm. Increased renal cortical echogenicity. Multiple  small anechoic renal masses with the largest in the right lower pole  measuring 2.2 x 2.2 x 2.2 cm and the largest in the upper pole  measuring 1.9 x 1.9 x 2 cm most consistent with cysts. There is no  hydronephrosis.    Left Kidney:    Length: 7 cm. Increased renal cortical echogenicity. Multiple small  anechoic renal masses with the largest in the left lower pole  measuring 1.5 x 1.4 x 1.7 cm and the largest in the upper pole  measuring 2.7 x 2.3 x 2.2 cm most consistent with cysts. There is no  hydronephrosis.    Bladder:  Decompressed bladder with a Foley catheter present.    Other: Bilateral pleural effusions. Small amount of abdominal  ascites.     IMPRESSION:  1. Atrophic kidneys with increased renal cortical echogenicity as  can be seen with medical renal disease.  2. Bilateral renal cysts.  3. Bilateral pleural effusions.  4. Small amount of abdominal ascites.      Electronically Signed    By: Kathreen Devoid    On: 05/07/2014 16:32         Verified By: Jennette Banker, M.D., MD  Cardiology:    07-Apr-16 17:46, Echo Doppler  Echo Doppler   REASON FOR EXAM:      COMMENTS:       PROCEDURE: [place] - ECHO DOPPLER COMPLETE(TRANSTHOR)  - May 06 2014  5:46PM     RESULT: Echocardiogram Report    Patient Name:   Carrie Hughes Date of Exam: 05/06/2014  Medical Rec #:  [id number]  Custom1:  Date of Birth: 13-Oct-1924 Height:       60.0 in  Patient Age:    4 years   Weight:       108.0 lb  Patient Gender: F          BSA:           1.44 m??    Indications: SOB  Sonographer:    Arville Go RDCS  Referring Phys: MODY, SITAL, P    Summary:   1. Moderatesized left pleural effusion   2. Moderate dilatation of the aortic root and ascending aorta.   3. Left ventricular ejection fraction, by visual estimation, is 40 to   45%.   4. Mildly decreased global left ventricular systolic function.   5. Select images suggestive of anterior and anteroseptal wall   hypokinesis.   6. Impaired relaxation pattern of LV diastolic filling.   7. Moderate concentric left ventricular hypertrophy.   8. Normal right ventricular size and systolic function.   9. Mildly dilated left atrium.  10. Mild to moderate mitral valve regurgitation.  11. Mild aortic valve stenosis.  12. Mild to moderate aortic regurgitation.  13. Mild tricuspid regurgitation.  14. Moderately elevated pulmonary artery systolic pressure.  15. Small pericardial effusion, with no significant hemodynamic   compromise.  2D AND M-MODE MEASUREMENTS (normal ranges within parentheses):  Left Ventricle:          Normal  IVSd (2D):      1.70 cm (0.7-1.1)  LVPWd (2D):     1.47 cm (0.7-1.1) Aorta/LA:            Normal  LVIDd (2D):     3.98 cm (3.4-5.7) Aortic Root (2D): 2.90 cm (2.4-3.7)  LVIDs (2D):     2.59 cm           Left Atrium (2D): 3.20 cm (1.9-4.0)  LV FS (2D):     34.9 %   (>25%)  LV EF (2D):     64.8 %   (>50%)             Right Ventricle:                                    RVd (2D):  LV SYSTOLIC FUNCTION BY 2D PLANIMETRY (MOD):  EF-A4C View: 08.6 %  LV DIASTOLIC FUNCTION:  MV Peak E: 0.86 m/s E/e' Ratio: 21.00  MV Peak A: 1.41 m/s Decel Time: 274 msec  E/A Ratio: 0.61  SPECTRAL DOPPLER ANALYSIS (where applicable):  Mitral Valve:  MV P1/2 Time: 79.46 msec  MV Area, PHT: 2.77 cm??  Aortic Valve: AoV Max Vel: 2.48 m/s AoV Peak PG: 24.7 mmHg AoV Mean PG:   13.3 mmHg  LVOT Vmax: 1.70 m/s LVOT VTI: 0.343 m LVOT Diameter: 1.60 cm  AoV Area, Vmax: 1.38  cm?? AoV Area, VTI: 1.36 cm?? AoV Area, Vmn: 1.37 cm??  Aortic Insufficiency:  AI Half-time:  613 msec  AI Decel Rate: 2.11 m/s??  Tricuspid Valve and PA/RV Systolic Pressure: TR Max Velocity: 3.10 m/s RA   Pressure: 15 mmHg RVSP/PASP: 53.4 mmHg  Pulmonic Valve:  PV Max Velocity: 1.85 m/s PV Max PG: 13.7 mmHg PV Mean PG:    PHYSICIAN INTERPRETATION:  Left Ventricle: The left ventricular internal cavity size was normal.   Moderate concentric left ventricular hypertrophy. Global LV systolic   function was mildly decreased. Left ventricular ejection fraction, by  visual estimation, is 40 to 45%. Spectral Doppler shows impaired   relaxation pattern of LV diastolic filling.  Right Ventricle: Normal right ventricular size, wall thickness, and     systolic function. The right ventricular size is normal. Global RV   systolic function is normal.  Left Atrium: The left atrium is mildly dilated.  Right Atrium: The right atrium is normal in size.  Pericardium: A small pericardial effusion is present.  Mitral Valve: The mitral valve is normal in structure. Mild to moderate   mitral valve regurgitation is seen.  Tricuspid Valve: The tricuspid valve is normal. Mild tricuspid   regurgitation is visualized. The tricuspid regurgitant velocity is 3.10   m/s, and with an assumed right atrial pressure of 15 mmHg, the estimated   right ventricular systolic pressure is moderately elevated at 53.4 mmHg.  Aortic Valve: Mild aortic stenosis is present. Mild to moderateaortic   valve regurgitation is seen.  Pulmonic Valve: The pulmonic valve is normal. Mild pulmonic valve   regurgitation.  Aorta: There is moderate dilatation of the aortic root and ascending   aorta.    [id number] Ida Rogue MD  Electronically signedby [id number] Ida Rogue MD  Signature Date/Time: 05/08/2014/10:04:47 AM    *** Final ***    IMPRESSION: .        Verified By: Minna Merritts, M.D., MD    No Known Allergies:   Vital  Signs/Nurse's Notes: **Vital Signs.:   12-Apr-16 06:26  Vital Signs Type Routine  Temperature Temperature (F) 97.7  Celsius 36.5  Temperature Source oral  Pulse Pulse 72  Respirations Respirations 22  Systolic BP Systolic BP 092  Diastolic BP (mmHg) Diastolic BP (mmHg) 50  Mean BP 77  Pulse Ox % Pulse Ox % 96  Pulse Ox Activity Level  At rest  Oxygen Delivery Non-invasive ventilation (CPAP/BIPAP)    Impression 81 year old female who is visiting from [place] with possible CKD, stage unknown, per her family presented to [facility] on 4/7 with a 30-day history of increased dyspnea and was found to be in acute on 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 Cardiology is consulted for further evaluation of CHF and pulmonary HTN.   1. Acute on chronic combined systolic and diastolic CHF with bilateral pleural effusions: -Likely 2/2 acute on chronic CKD given findings of her echo (EF 45%, moderately elevated right sided pressures 54 mm Hg) -Volume management currently through HD -IM is discussing the possibility of  thoracentesis on the left   This would be a risky/complicated procedure given her down trending platelet count, currently at 56, and she is frail -Other possible option include continued HD in an attempt to pull fluid off, though she did briefly demcompensate 4/11 requiring BiPAP -Lopressor discontinued on 4/11 secondary to bradycardia -Continue Imdur and hydralazine  -No plans at this time for ischemia work up after discussion with family. She is not having ischemia sx, very frail  2. Acute on likely CKD, now ESRD: -HD per Renal -? plans upon her arrival back to [place] Medicine service has talked with family, she does not want long term HD  3. Thrombocytopenia: -Possible HITT in the setting of HD Will defer to renal as possibly getting flushes  4. Accelerated HTN: -Improved  5. Anemia: -May need Procrit   Electronic  Signatures: Rise Mu (PA-C)  (Signed 12-Apr-16 11:24)  Authored: General Aspect/Present Illness, History and Physical Exam, Review of System, Family & Social History, Home  Medications, Labs, EKG , Radiology, Allergies, Vital Signs/Nurse's Notes, Impression/Plan Ida Rogue (MD)  (Signed 12-Apr-16 11:50)  Authored: General Aspect/Present Illness, History and Physical Exam, Review of System, Family & Social History, Health Issues, Labs, EKG , Radiology, Impression/Plan  Co-Signer: General Aspect/Present Illness, History and Physical Exam, Review of System, Family & Social History, Home Medications, Labs, EKG , Radiology, Allergies, Vital Signs/Nurse's Notes, Impression/Plan   Last Updated: 12-Apr-16 11:50 by Ida Rogue (MD)

## 2014-05-30 NOTE — Consult Note (Signed)
Present Illness The 81 year old female who does not speak English who is actually visiting from [place].  She presents with increasing shortness of breath, dyspnea on exertion, and anasarca. Her symptoms have been persisting for the past 30 days; however, worsened over the past few days.  Family was concerned so brought her to the ER for further evaluation. In the ER, she is noted to have acute renal failure, mild pulmonary edema on a chest x-ray, and hyperkalemia.   PAST MEDICAL HISTORY:  None.  Medications:  patient is not taking any medications at this time.    No Known Allergies:   Case History:  Family History no porphyria no autoimmune disease   Social History negative tobacco, negative ETOH, negative Illicit drugs   Review of Systems:  ROS No TIA/stroke/seizure No heat or cold intolerance No dysuria/hematuria No blurry or double vision No tinnitus or ear pain No rashes or ulcer No suicidal ideation or psychosis No signs of bleeding or easy bruising No SOB/DOE, orthopnea, or sputum No palpitations or chest pain No N/V/D or abdominal pain No joint pain or joint swelling No fever or chills No unintentional weight loss or gain   Physical Exam:  GEN well developed, well nourished   HEENT hearing intact to voice, moist oral mucosa, good dentition   NECK supple  trachea midline   RESP normal resp effort  no use of accessory muscles   CARD regular rate  LE edema present  positive JVD   ABD soft   EXTR negative cyanosis/clubbing, positive edema   SKIN No rashes, No ulcers   NEURO cranial nerves intact, motor/sensory function intact   PSYCH alert, anxious   Nursing/Ancillary Notes: **Vital Signs.:   08-Apr-16 02:00  Temperature Temperature (F) 98.6  Celsius 37  Temperature Source oral  Pulse Pulse 62  Respirations Respirations 16  Systolic BP Systolic BP 416  Diastolic BP (mmHg) Diastolic BP (mmHg) 57  Mean BP 75  Pulse Ox % Pulse Ox % 100    Hepatic:  08-Apr-16 04:50   Albumin, Serum  2.3 (3.5-5.0 NOTE: New reference range  04/06/14)  Routine Chem:  08-Apr-16 04:50   Glucose, Serum 83 (65-99 NOTE: New Reference Range  04/06/14)  BUN  43 (6-20 NOTE: New Reference Range  04/06/14)  Creatinine (comp)  3.51 (0.44-1.00 NOTE: New Reference Range  04/06/14)  Sodium, Serum 143 (135-145 NOTE: New Reference Range  04/06/14)  Potassium, Serum 4.8 (3.5-5.1 NOTE: New Reference Range  04/06/14)  Chloride, Serum  117 (101-111 NOTE: New Reference Range  04/06/14)  CO2, Serum 22 (22-32 NOTE: New Reference Range  04/06/14)  Calcium (Total), Serum  7.7 (8.9-10.3 NOTE: New Reference Range  04/06/14)  Phosphorus, Serum 4.2 (2.5-4.6 NOTE: New Reference Range  04/06/14)  Anion Gap  4  eGFR (African American)  13  eGFR (Non-African American)  11 (eGFR values <85m/min/1.73 m2 may be an indication of chronic kidney disease (CKD). Calculated eGFR is useful in patients with stable renal function. The eGFR calculation will not be reliable in acutely ill patients when serum creatinine is changing rapidly. It is not useful in patients on dialysis. The eGFR calculation may not be applicable to patients at the low and high extremes of body sizes, pregnant women, and vegetarians.)  Result Comment labs - This specimen was collected through an   - indwelling catheter or arterial line.  - A minimum of 522m of blood was wasted prior    - to collecting the sample.  Interpret  -  results with caution.  Result(s) reported on 07 May 2014 at 05:18AM.  Cardiac:  08-Apr-16 04:50   CK, Total 69 (38-234 NOTE: New Reference Range  04/06/14)  CPK-MB, Serum  7.9 (0.5-5.0 NOTE: New Reference Range  04/06/14)  Routine Hem:  08-Apr-16 04:50   WBC (CBC) 5.0  RBC (CBC)  2.33  Hemoglobin (CBC)  7.5  Hematocrit (CBC)  23.9  Platelet Count (CBC)  76  MCV  102  MCH 32.0  MCHC  31.3  RDW  17.6  Neutrophil % 61.0  Lymphocyte % 30.9   Monocyte % 7.2  Eosinophil % 0.3  Basophil % 0.6  Neutrophil # 3.1  Lymphocyte # 1.5  Monocyte # 0.4  Eosinophil # 0.0  Basophil # 0.0 (Result(s) reported on 07 May 2014 at 05:10AM.)    Impression 1.  Acute renal failure, symptomatic.  The patient presents with hyperkalemia, anasarca, pulmonary edema on chest x-ray.  Etiology of acute renal failure at this time is unclear.  We will order a renal ultrasound.  Dr. Juleen China  is on consult and has asked me to evaluate for immediate access to begin dialysis.  Based on my evaluation she also needs central access as she has very poor peripheral iv access.  Trialysis catheter will be chosen for access.  Monitor Is and Os and daily weights.  Order a renal ultrasound and echocardiogram.  All other laboratories will ordered by the nephrologist as indicated.  2.  Pulmonary edema.  I suspect this is from fluid overload from her acute renal failure which needs to be addressed likely by dialysis. Will try Lasix to see if this helps. 3.  Accelerated hypertension likely in part due to her acute renal failure.  I have written for hydralazine.  We need to avoid any nephrotoxic medications and will follow the patient closely.  4.  Hyperkalemia. Will treat with Kayexalate, insulin, and D50.  Hyperkalemia is from for her acute renal failure which may be addressed with dialysis. Will repeat the potassium later on after giving medications.  5.  Elevated troponin, likely demand ischemia. Will continue to cycle her troponins.  Will also order echocardiogram for further work-up.  6.  Anemia likely from chronic disease. Will check iron panel.  Further work-up after we get the results back from her iron studies. Will also include B12 to this iron study.   Electronic Signatures: Hortencia Pilar (MD)  (Signed 08-Apr-16 07:50)  Authored: General Aspect/Present Illness, Allergies, History and Physical Exam, Vital Signs, Labs, Impression/Plan   Last Updated: 08-Apr-16 07:50  by Hortencia Pilar (MD)

## 2014-05-30 NOTE — Consult Note (Signed)
Brief Consult Note: Patient was seen by consultant.   Consult note dictated.   Discussed with Attending MD.   Comments: little to suggest SLE by history. proteinuria and esrd at presentation.would expect much higher anti DNA and lower complement if this were acute SLE. no recommendation for Bx or immunosuppression.  Electronic Signatures: Royann ShiversKernodle, Jr., Helen HashimotoGeorge Wallace (MD)  (Signed 15-Apr-16 13:16)  Authored: Brief Consult Note   Last Updated: 15-Apr-16 13:16 by Royann ShiversKernodle, Jr., Helen HashimotoGeorge Wallace (MD)

## 2014-05-30 NOTE — Op Note (Signed)
PATIENT NAME:  Carrie HinesKWON, Faelyn MR#:  [id number] DATE OF BIRTH:  February 25, 1924  DATE OF PROCEDURE:  05/06/2014  PREOPERATIVE DIAGNOSES:  1.  Acute renal failure.  2.  Myocardial ischemia.   POSTOPERATIVE DIAGNOSES:  1.  Acute renal failure.  2.  Myocardial ischemia.   PROCEDURE PERFORMED: Insertion of triple-lumen temporary dialysis catheter with ultrasound guidance.   SURGEON: Renford DillsGregory G. Onyekachi Gathright, MD.   DESCRIPTION OF PROCEDURE: The patient is in the Intensive Care Unit. She is positioned supine and her right neck is prepped and draped in a sterile fashion. Ultrasound is placed in a sterile sleeve. Appropriate timeout is called.   Ultrasound is used to evaluate the jugular vein which is echolucent and compressible indicating patency. Image is recorded for the permanent record and under real-time visualization a micropuncture needle is inserted, microwire followed by micro sheath, J-wire followed by the dilator and then the triple-lumen temporary dialysis catheter. All 3 lumens aspirate and flush easily and the catheter is secured to the skin of the neck with 2-0 nylon. Biopatch and sterile dressing is applied. The patient tolerated the procedure well. Chest x-ray demonstrates catheter in excellent position, no evidence of hemopneumothorax.    ____________________________ Renford DillsGregory G. Ayomide Purdy, MD ggs:bu D: 05/06/2014 20:31:10 ET T: 05/06/2014 20:43:37 ET JOB#: [id number]  cc: Renford DillsGregory G. Barney Gertsch, MD, <Dictator> Laverda SorensonSarath C. Kolluru, MD Renford DillsGREGORY G Mahlani Berninger MD ELECTRONICALLY SIGNED 05/11/2014 15:14

## 2014-05-30 NOTE — Consult Note (Signed)
PATIENT NAME:  Carrie HinesKWON, Felisha MR#:  [id number] DATE OF BIRTH:  [birth year]  DATE OF CONSULTATION:  05/14/2014  REFERRING PHYSICIAN:   CONSULTING PHYSICIAN:  Dessie ComaGeorge Wallace Kernodle Jr., MD  REFERRING PHYSICIAN:  Adrian SaranSital Mody, MD.    REASON FOR CONSULTATION: Rule out lupus.   HISTORY OF PRESENT ILLNESS: An 81 year old [place] female visiting her son. History through the son and interpreter. Had sudden onset of swelling and shortness of breath. [facility]. Echo of the kidneys looked like end-stage renal disease. Had pleural effusions with thoracentesis. No obvious infection. She has been on dialysis with improvement. Part of the evaluation were serologies. Had ANA anti-DNA of 11. Complements normal. Urine showed protein, but no significant red cells or casts. She is anemic but not thrombocytopenic. In questioning the son  she has not had preceding Raynaud's, inflammatory arthritis, sun photosensitivity, serositis, oral ulcers, blood clots, or any family history of connective tissue diseases. The patient is improved with dialysis.   PAST MEDICAL HISTORY:  Renal insufficiency.   SOCIAL HISTORY: No cigarettes or alcohol.   FAMILY HISTORY: Negative for rheumatic diseases.   REVIEW OF SYSTEMS: Through the son.  No joint pain. No chest pain. No abdominal pain. Eating well. Some weight loss. Some edema.   PHYSICAL EXAMINATION:  GENERAL: Pleasant female, in no distress.  VITAL SIGNS:  120/41, temperature 98, pulse 74, respiratory rate of 20, pulse oxygen 98 on 2 liters.  HEENT: Sclerae clear. Oropharynx clear without ulcers.  NECK: Supple. No thyromegaly.  LUNGS: Distant lung sounds, but no rales.  HEART:  No significant murmur or rub.  ABDOMEN: Nontender.  EXTREMITIES: 2 + lower extremity edema.  MUSCULOSKELETAL: Good range of neck and shoulders. Hands without synovitis. Small cool knee effusions. Ankles and toes without synovitis. Hips move well.  NEUROLOGIC: Moves all  extremities. Hard to judge her understanding given the language barrier.   IMPRESSION:  End-stage renal disease, unclear etiology, proteinuria. Low positive anti-DNA, but with normal complement and no historical features of lupus.   PLAN: No further workup. Would not recommend a renal biopsy or immunosuppressive agents.   ____________________________ Dessie ComaGeorge Wallace Kernodle Jr., MD gwk:bu D: 05/14/2014 13:21:13 ET T: 05/14/2014 13:43:18 ET JOB#: [id number]  cc: Dessie ComaGeorge Wallace Kernodle Jr., MD, <Dictator> Webb SilversmithGEORGE W KERNODLE J MD ELECTRONICALLY SIGNED 05/18/2014 13:23

## 2014-05-31 NOTE — Discharge Summary (Signed)
Dates of Admission and Diagnosis:  Date of Admission 06-May-2014   Date of Discharge 28-May-2014   Admitting Diagnosis Acute renal failure- fluid overload.   Final Diagnosis End stage renal disease on Dialysis Hypertension Anemia Acute systolic+ diastolic heart failure Chronic respi failure- need oxygen    Chief Complaint/History of Present Illness a pleasant 81 year old female [place] who presents with increasing shortness of breath, dyspnea on exertion, and anasarca. Her symptoms have been persisting for the past 30 days; however, worsened over the past few days.  Family was concerned so brought her to the ER for further evaluation. In the ER, she is noted to have acute renal failure, mild pulmonary edema on a chest x-ray, and hyperkalemia.   Allergies:  No Known Allergies:   Pertinent Past History:  Pertinent Past History none   Hospital Course:  Hospital Course 1. Fluid overload with acute pulmonary edema and anasarca 2/2 systolic CHF and ESRD - volume status improved with HD and lasix, cont HD as per nephrology.    PERmacath placed, and arranged for out pt HD.  2. Acute on chronic renal failure, now  progressed to ESRD: etiology unclear. - ANA, DS DNA +, but unlikely lupus.   - s/p Perm cath 4/25. approval for outpatient HD.    3. Accelereted HTN on presentation - resolved. - stable on imdur, Losartan, Coreg.   4. Anemia, likely of chronic disease - procrit per nephrology with HD.   - Hg. stable.   5. thrombocytopenia - improved.  etiology unclear but resolved.  - Hep C. (-). HIT ab (-).    6. Elevated troponin - demand ischemia, no further ischemic work up planned - appreciate cardiology consulation  7. LUE edema: resolved with HD - dopplers (-) for DVT.   8. pleural effusion - thoracentesis shows transudate, likely due to volume overload CHF/ESRD, cx NTD  9. ac systolic+ diastolic CHF on presentation   better now- on HD.  Per Echo EF 40%.   need Oxygen on discharge.   Condition on Discharge Stable   Code Status:  Code Status No Code/Do Not Resuscitate   DISCHARGE INSTRUCTIONS HOME MEDS:  Medication Reconciliation: Patient's Home Medications at Discharge:     Medication Instructions  losartan 25 mg oral tablet  1 tab(s) orally once a day   isosorbide mononitrate 60 mg oral tablet, extended release  1 tab(s) orally once a day   carvedilol 3.125 mg oral tablet  1 tab(s) orally 2 times a day     Physician's Instructions:  Home Oxygen? Yes   Oxygen delivery at home: 1L  Nasal Cannula   Diet Low Sodium  Renal Diet   Activity Limitations As tolerated   Return to Work Not Applicable   Time frame for Follow Up Appointment 1-2 weeks  nephrology   Electronic Signatures: Altamese DillingVachhani, Amjad Fikes (MD)  (Signed [phone] [id number]-May-16 08:55)  Authored: ADMISSION DATE AND DIAGNOSIS, CHIEF COMPLAINT/HPI, Allergies, PERTINENT PAST HISTORY, HOSPITAL COURSE, DISCHARGE INSTRUCTIONS HOME MEDS, PATIENT INSTRUCTIONS   Last Updated: 02-May-16 08:55 by Altamese DillingVachhani, Cashae Weich (MD)

## 2016-10-31 IMAGING — CR DG CHEST 2V
1 series · 2 of 2 positions shown · non-contrast
Comparison: None

CLINICAL DATA: Swelling and LEFT arm and shortness of breath off
and on for 1 month

EXAM:
CHEST  2 VIEW

[Series 1: dxr chest pa (or ap) and lateral · 0.14mm/px · 2 of 2 slices shown]
[im 1/2]
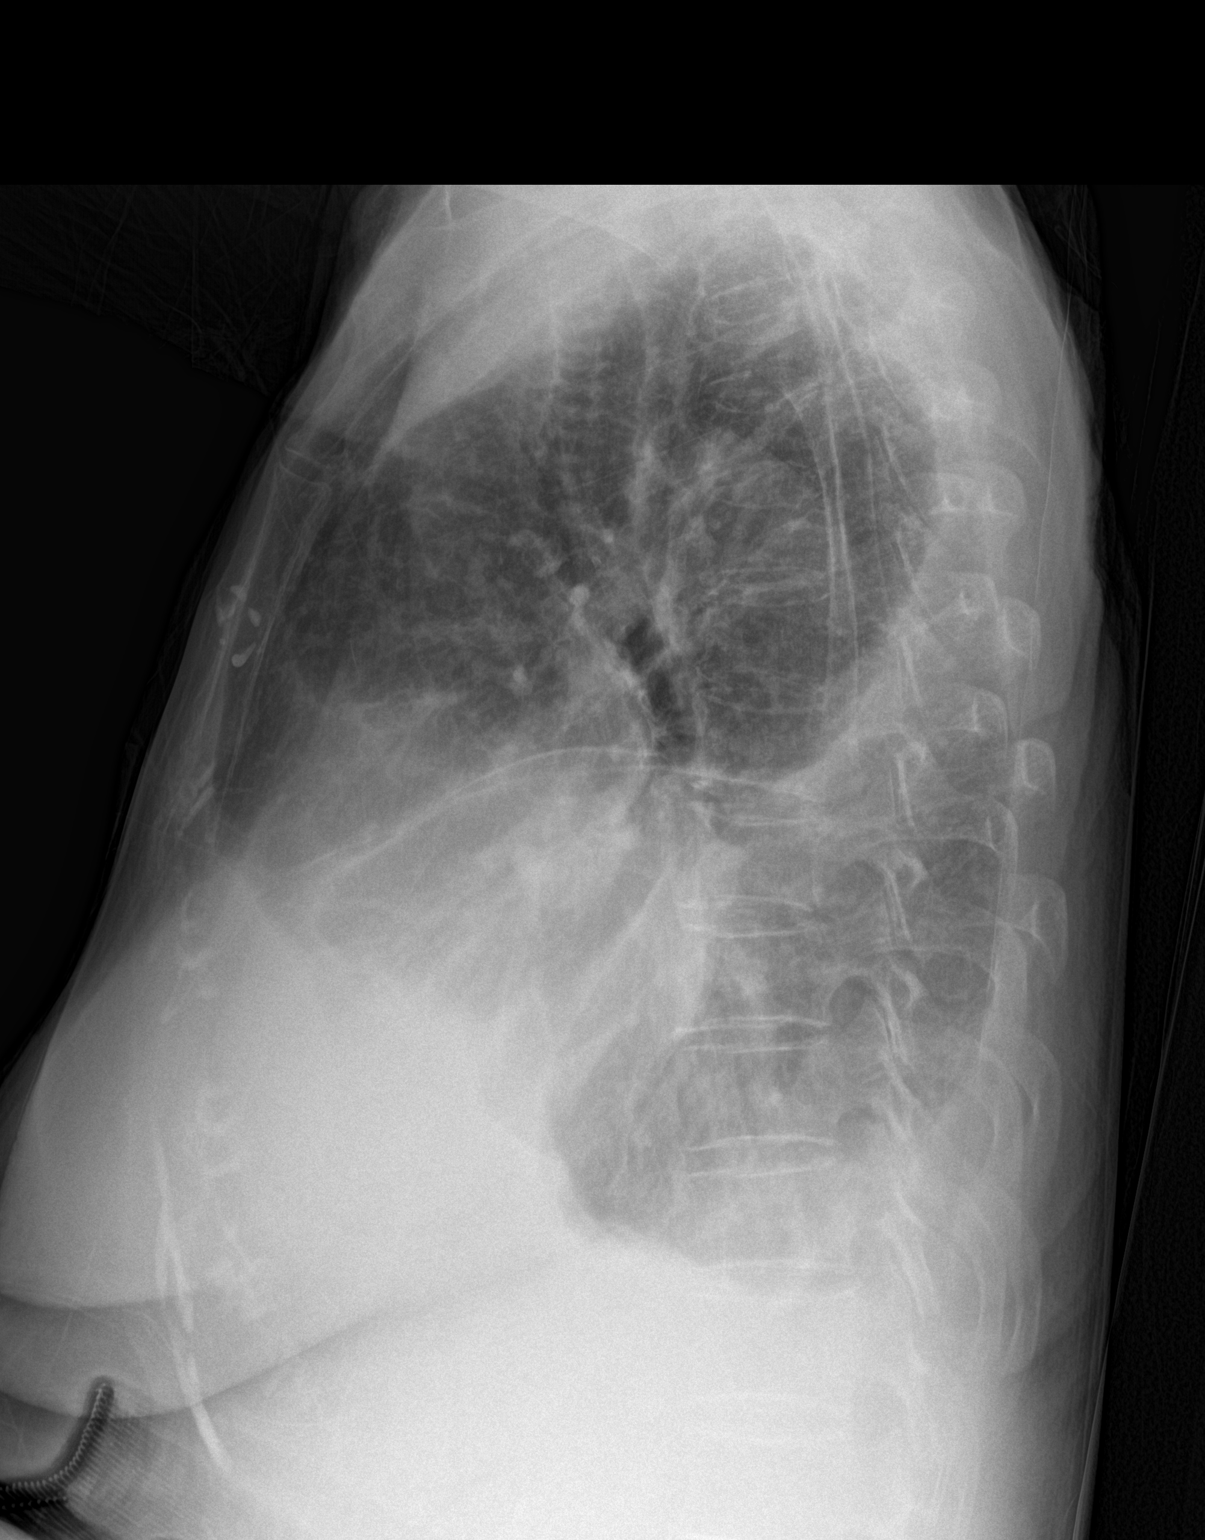
[im 2/2]
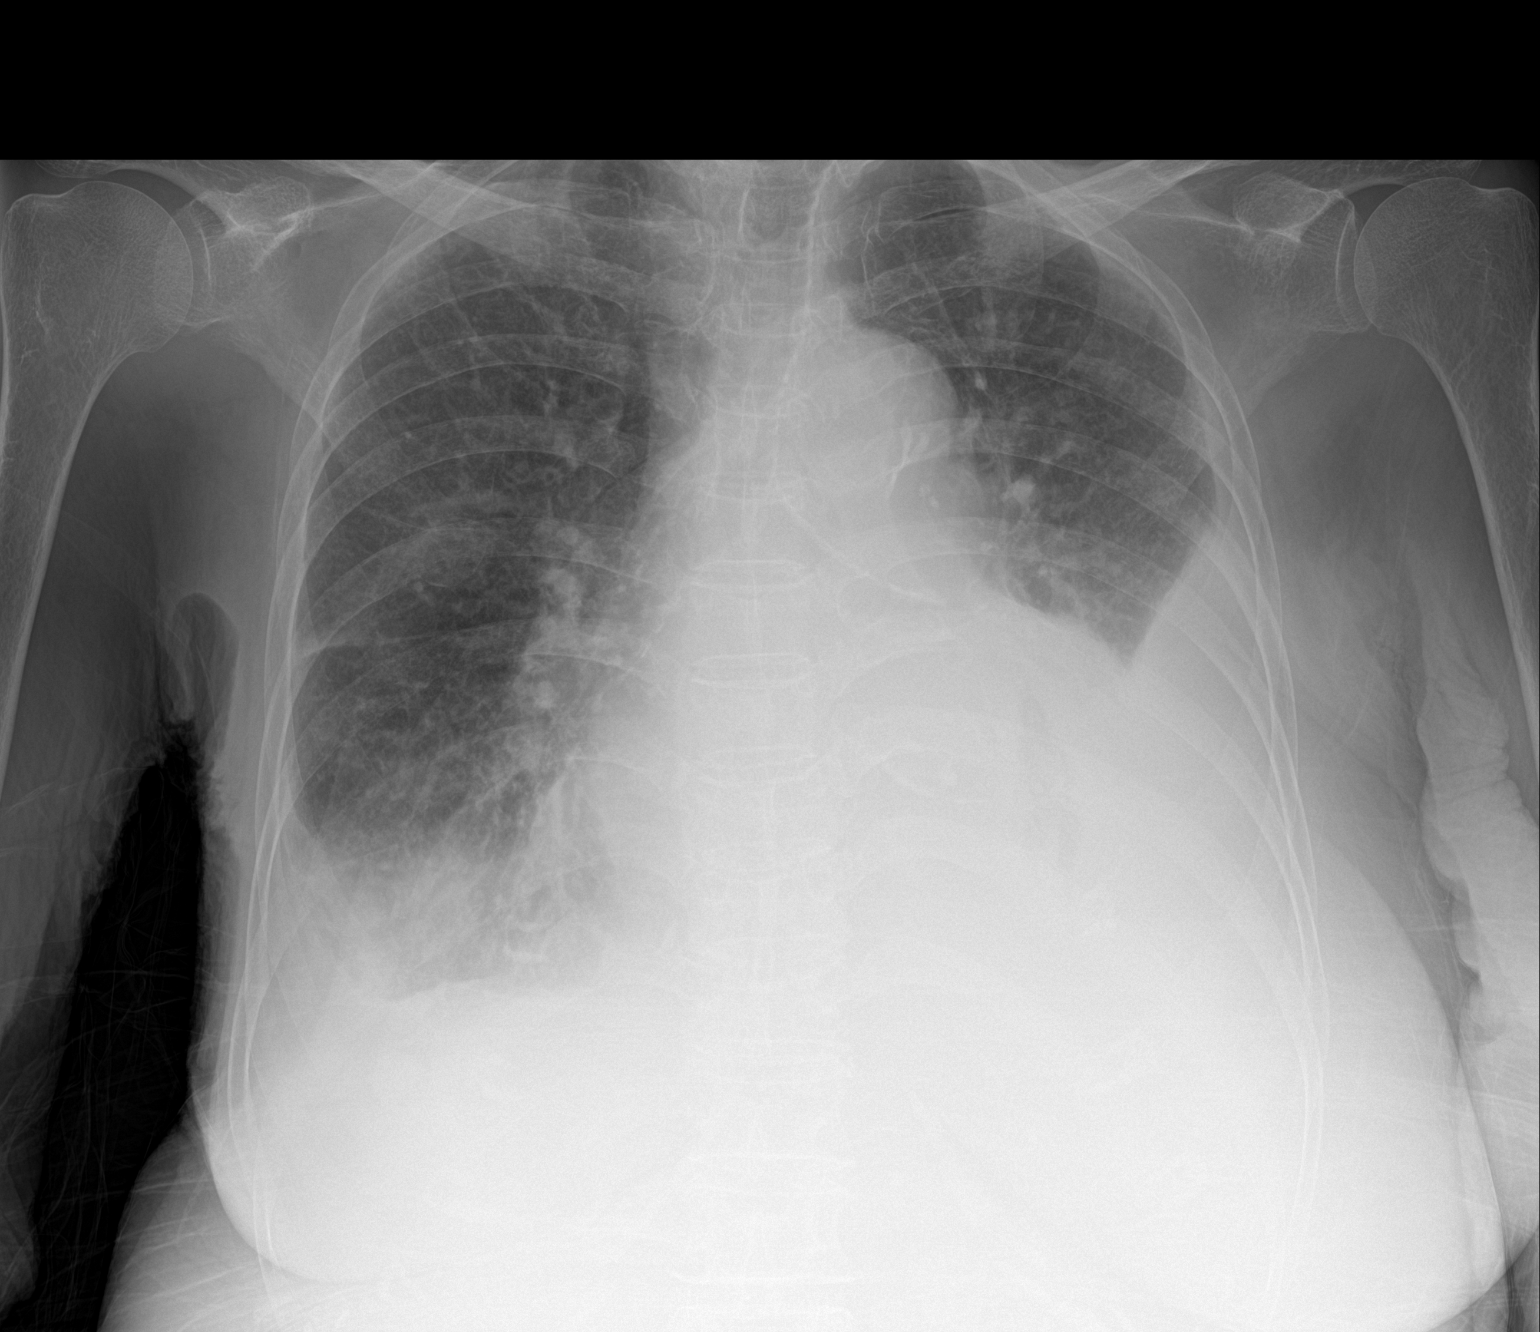

[2 of 2 positions shown; findings below may reference images not displayed]

FINDINGS: Enlargement of cardiac silhouette with slight vascular congestion.

Calcification in probable mild tortuosity of thoracic aorta.

BILATERAL pleural effusions and basilar atelectasis greater on LEFT.

Unable to exclude underlying infiltrate or mass at LEFT lower lobe.

Underlying emphysematous changes.

Question minimal perihilar edema.

No pneumothorax.

Bones demineralized.
IMPRESSION: Enlargement of cardiac silhouette with pulmonary vascular congestion
and suspect minimal perihilar edema consistent with CHF.

BILATERAL pleural effusions and basilar atelectasis LEFT greater
than RIGHT.

## 2016-11-01 IMAGING — US US RENAL KIDNEY
1 series · 14 of 25 positions shown · non-contrast
Comparison: None.

CLINICAL DATA: Acute renal failure.  Dialysis this morning.

EXAM:
RENAL/URINARY TRACT ULTRASOUND COMPLETE

[Series 1: us renal kidney · 0.28mm/px · 14 of 55 slices shown]
[im 1/55]
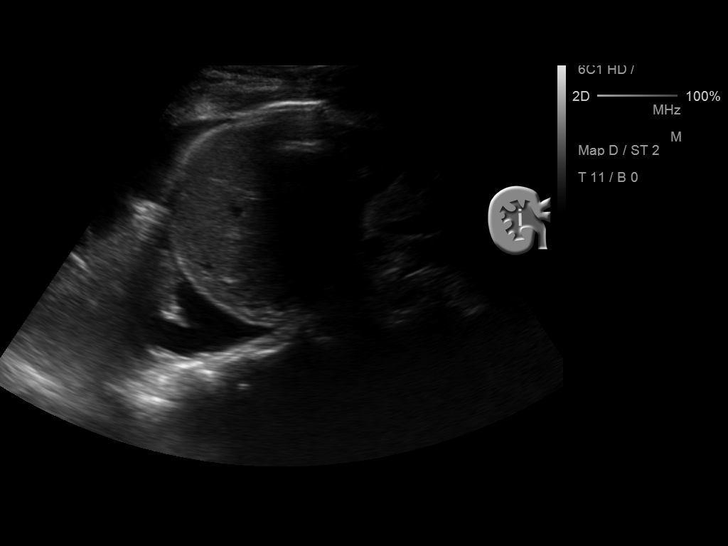
[im 5/55]
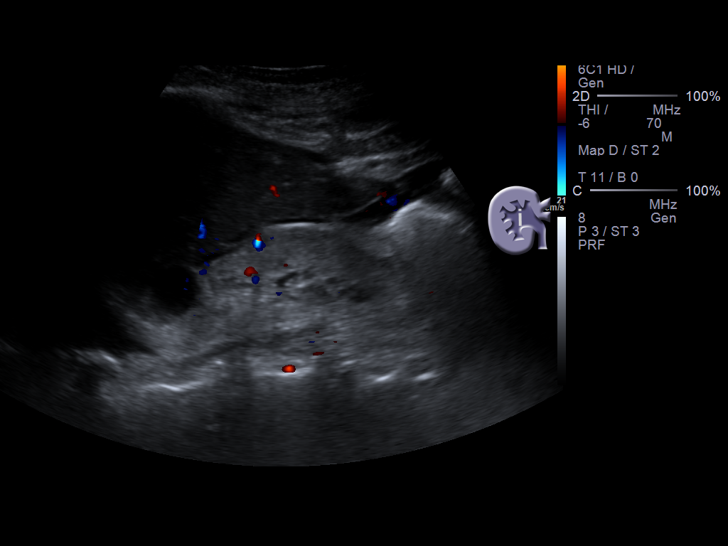
[im 10/55]
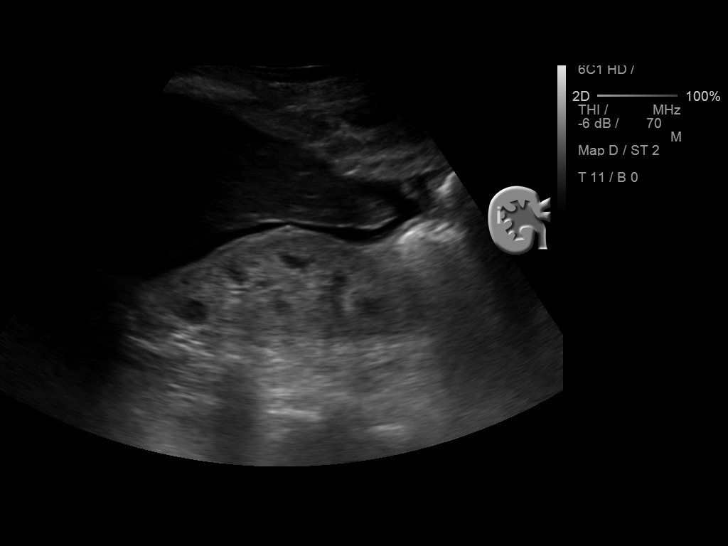
[im 14/55]
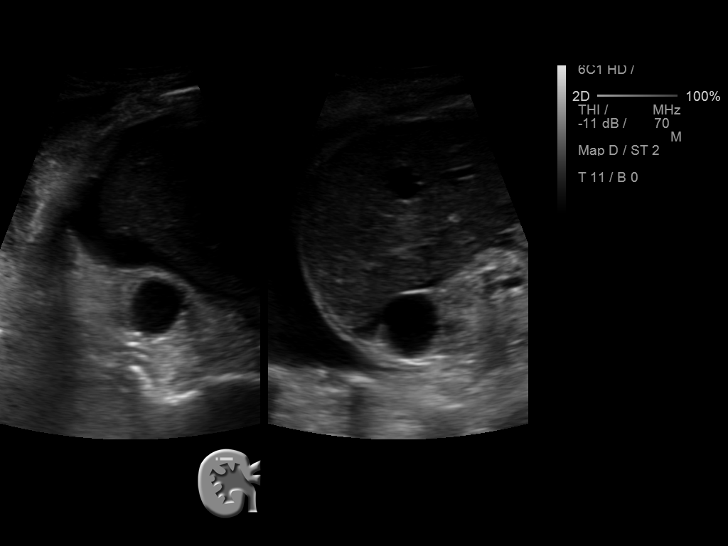
[im 19/55]
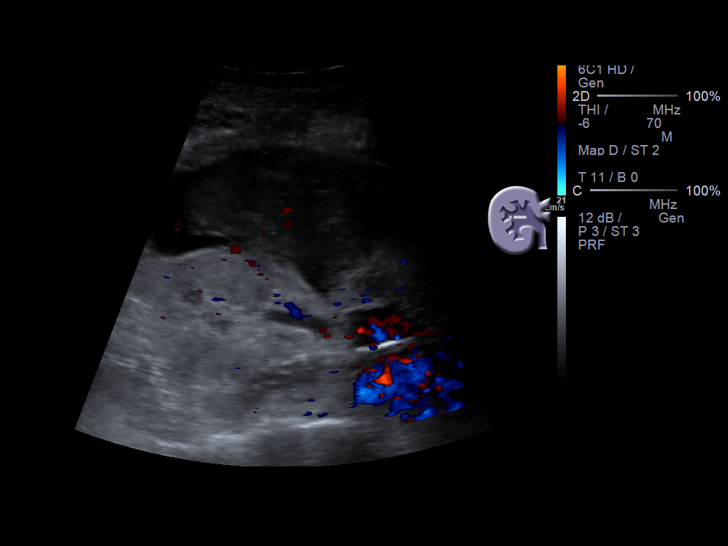
[im 21/55]
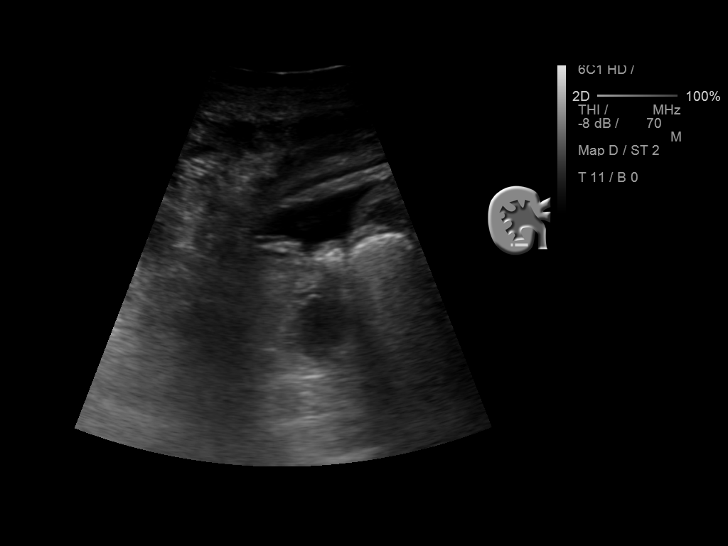
[im 25/55]
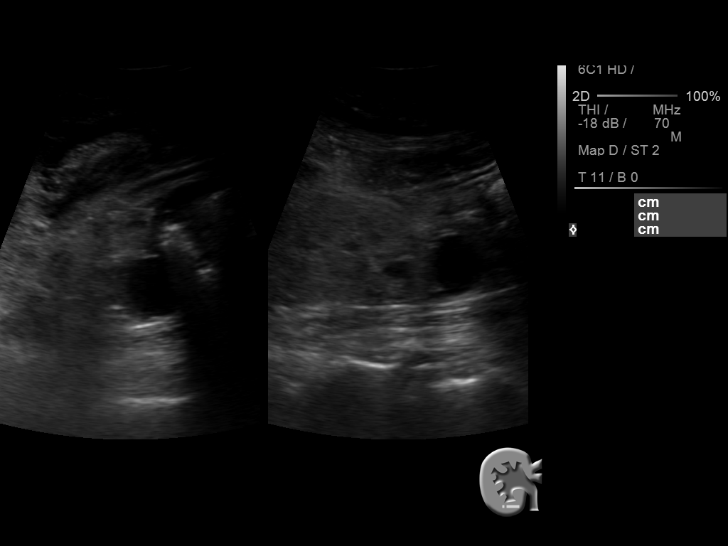
[im 30/55]
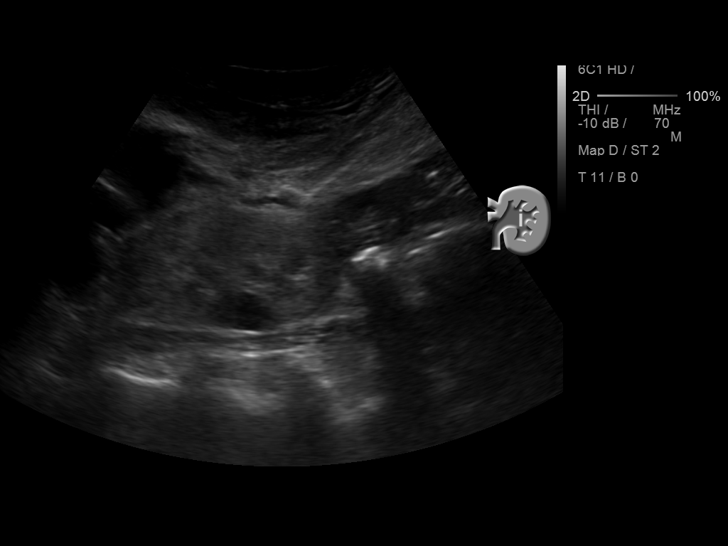
[im 34/55]
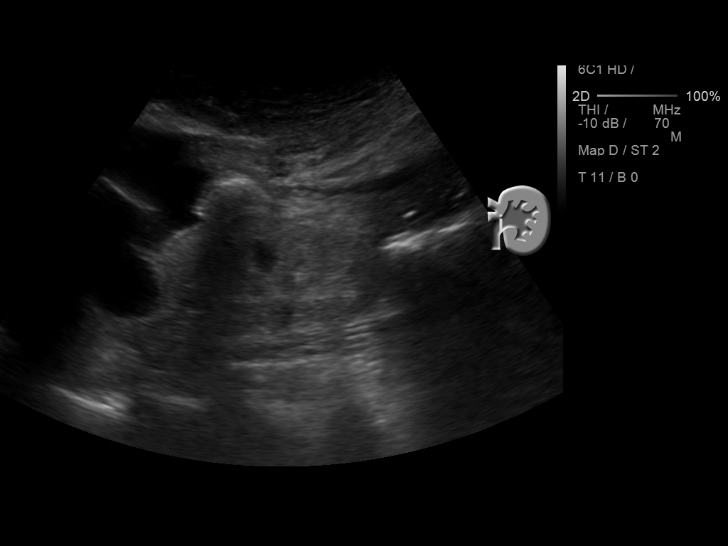
[im 37/55]
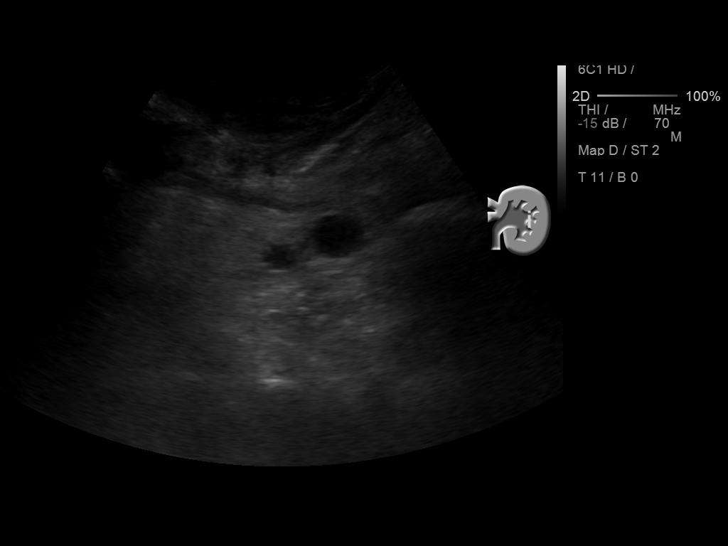
[im 41/55]
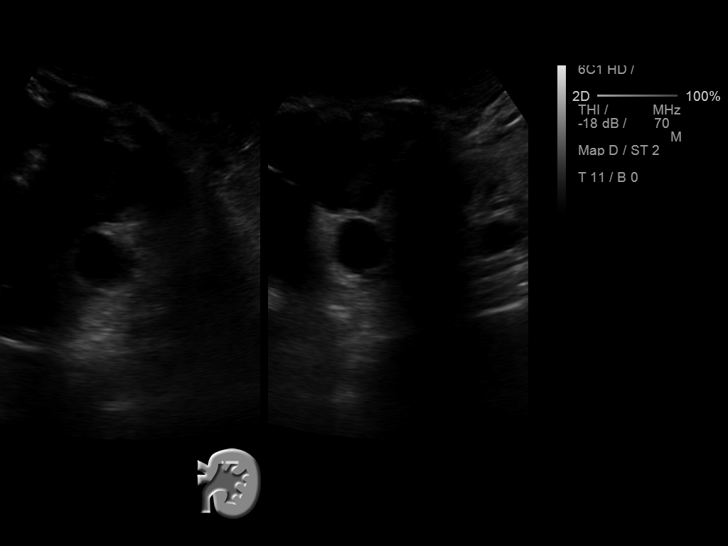
[im 46/55]
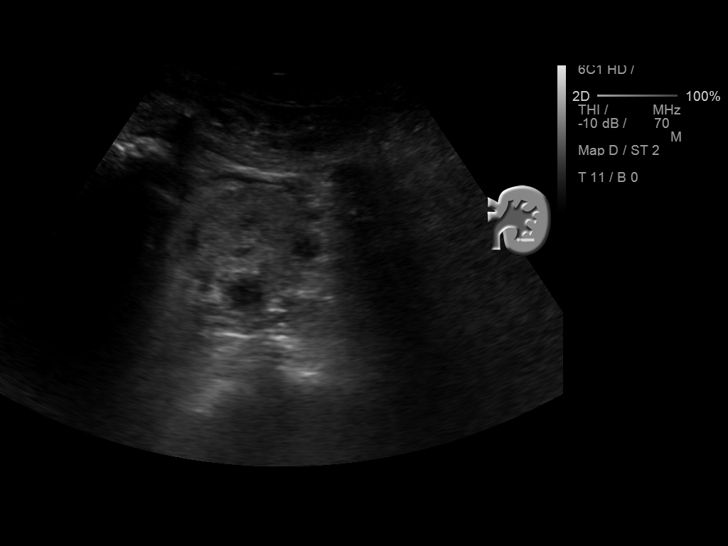
[im 50/55]
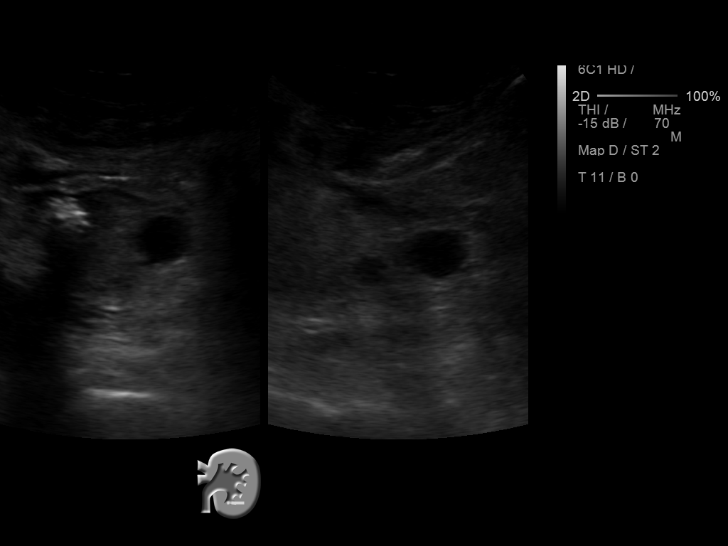
[im 55/55]
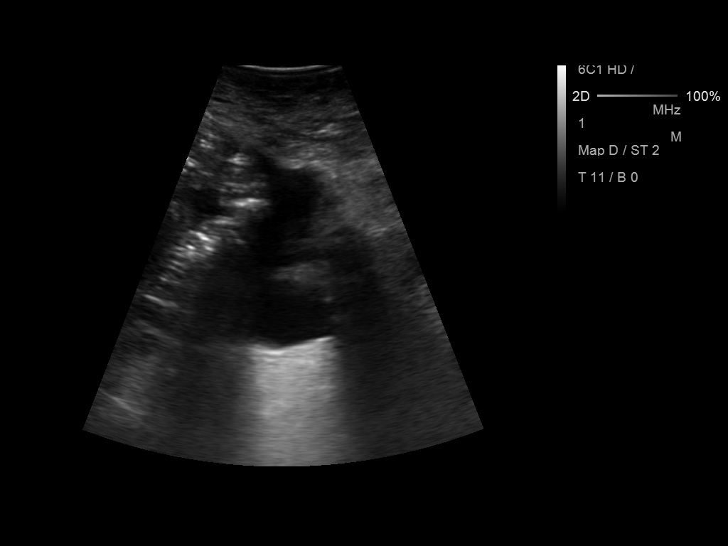

[14 of 25 positions shown; findings below may reference images not displayed]

FINDINGS: Right Kidney:

Length: 7.6 cm. Increased renal cortical echogenicity. Multiple
small anechoic renal masses with the largest in the right lower pole
measuring 2.2 x 2.2 x 2.2 cm and the largest in the upper pole
measuring 1.9 x 1.9 x 2 cm most consistent with cysts. There is no
hydronephrosis.

Left Kidney:

Length: 7 cm. Increased renal cortical echogenicity. Multiple small
anechoic renal masses with the largest in the left lower pole
measuring 1.5 x 1.4 x 1.7 cm and the largest in the upper pole
measuring 2.7 x 2.3 x 2.2 cm most consistent with cysts. There is no
hydronephrosis.

Bladder:

Decompressed bladder with a Foley catheter present.

Other: Bilateral pleural effusions. Small amount of abdominal
ascites.
IMPRESSION: 1. Atrophic kidneys with increased renal cortical echogenicity as
can be seen with medical renal disease.
2. Bilateral renal cysts.
3. Bilateral pleural effusions.
4. Small amount of abdominal ascites.

## 2016-11-01 IMAGING — US US EXTREM UP VENOUS*L*
1 series · 14 of 24 positions shown · non-contrast
Comparison: None.

CLINICAL DATA: Left upper extremity edema for 1 month

EXAM:
LEFT UPPER EXTREMITY VENOUS DUPLEX ULTRASOUND
TECHNIQUE: Doppler venous assessment of the left upper extremity deep venous
system was performed, including characterization of spectral flow,
compressibility, and phasicity.

[Series 1: us extrem up venous*left* · 0.08mm/px · 14 of 30 slices shown]
[im 1/30]
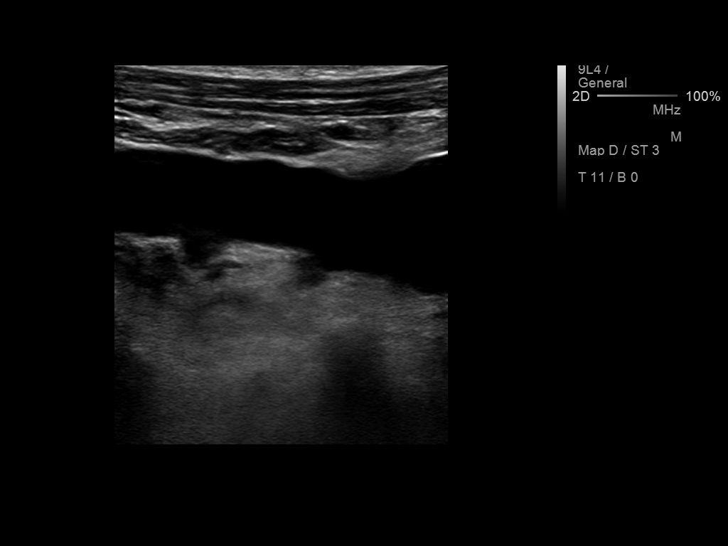
[im 3/30]
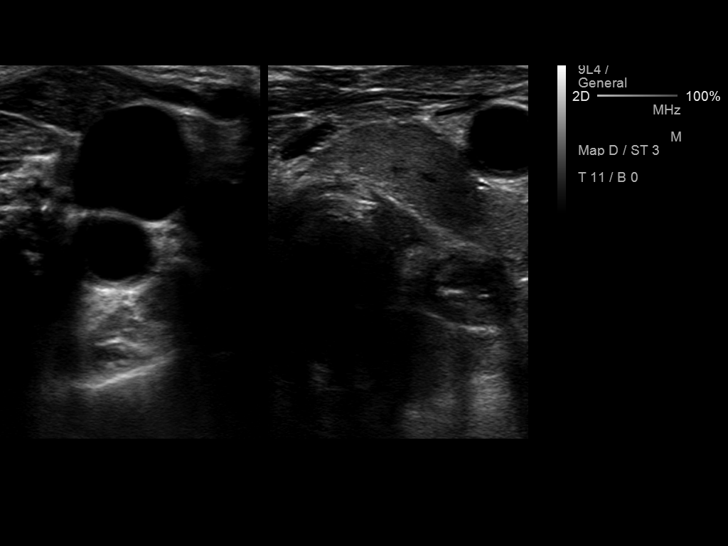
[im 6/30]
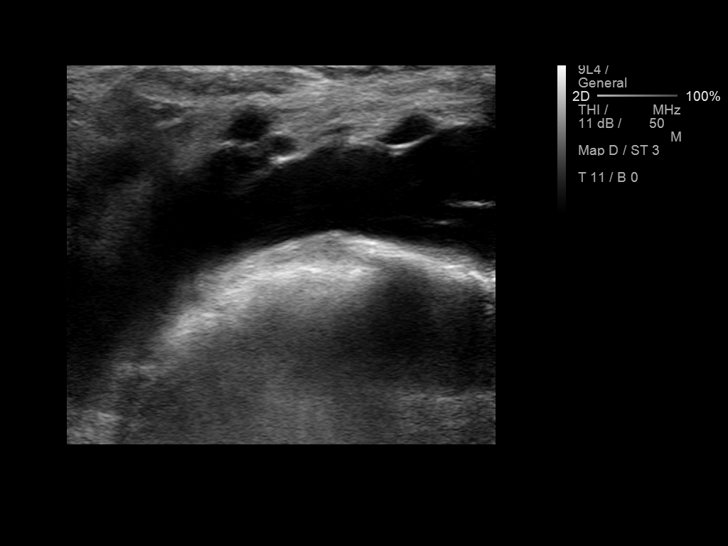
[im 8/30]
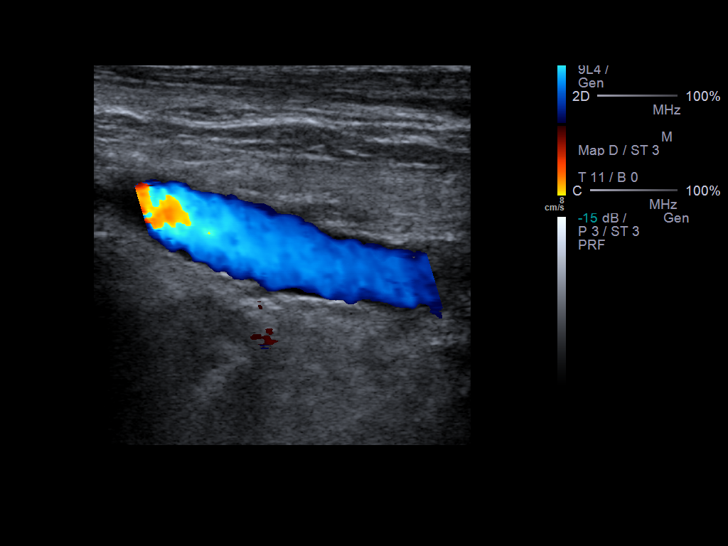
[im 9/30]
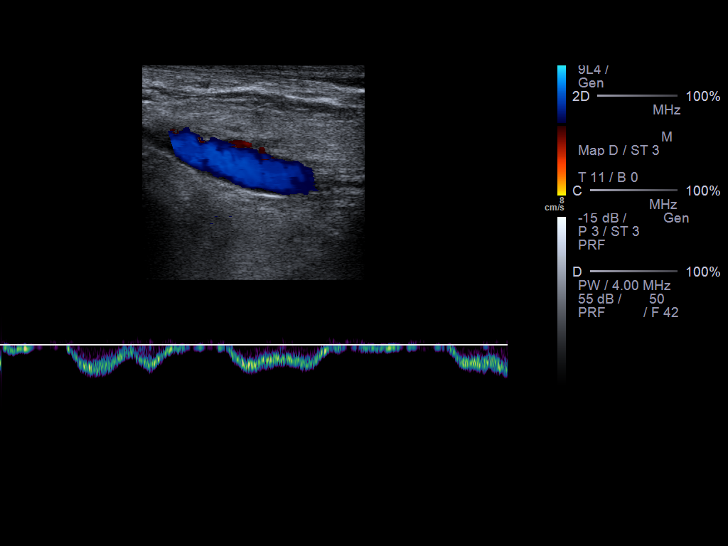
[im 12/30]
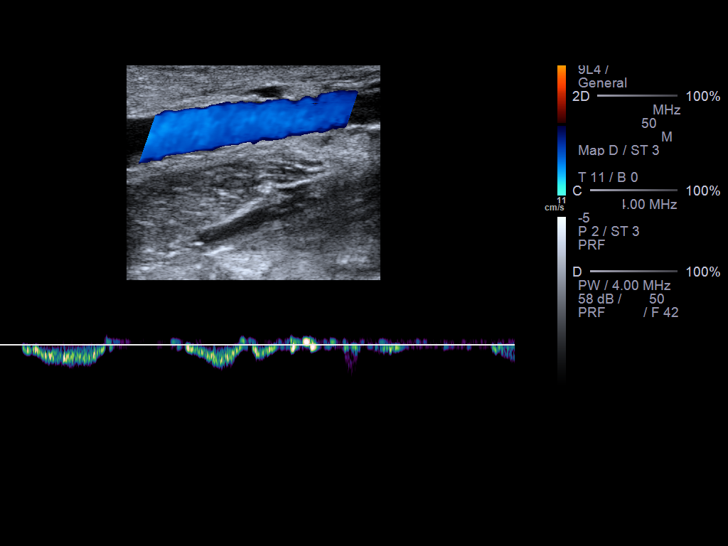
[im 14/30]
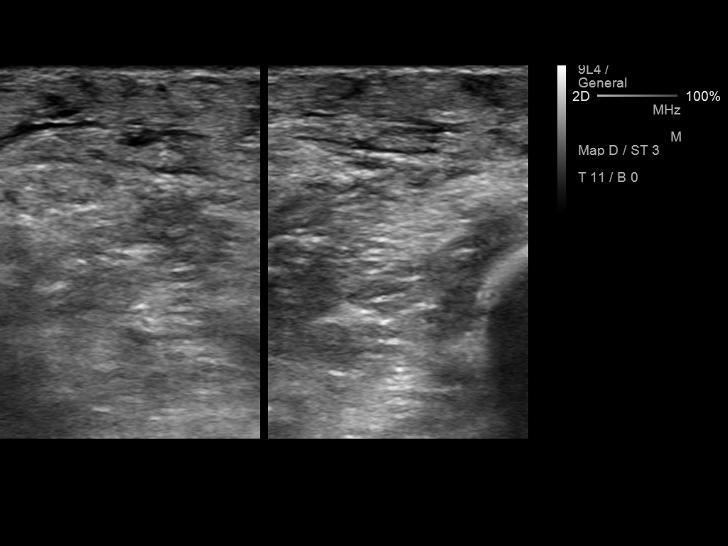
[im 16/30]
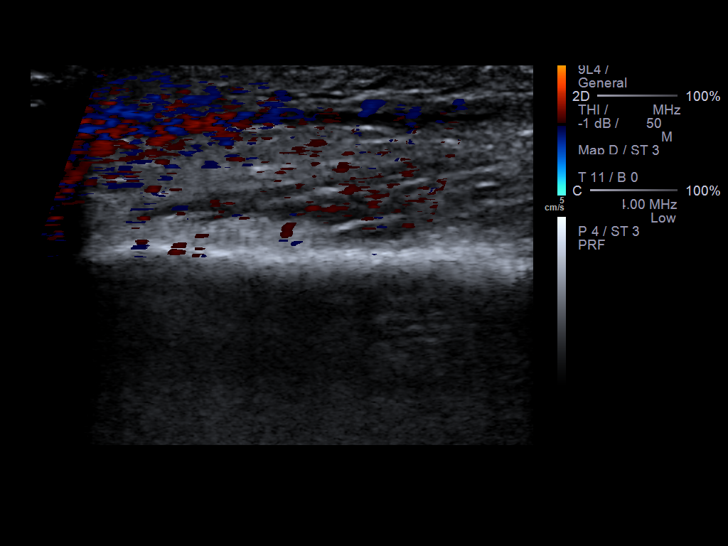
[im 18/30]
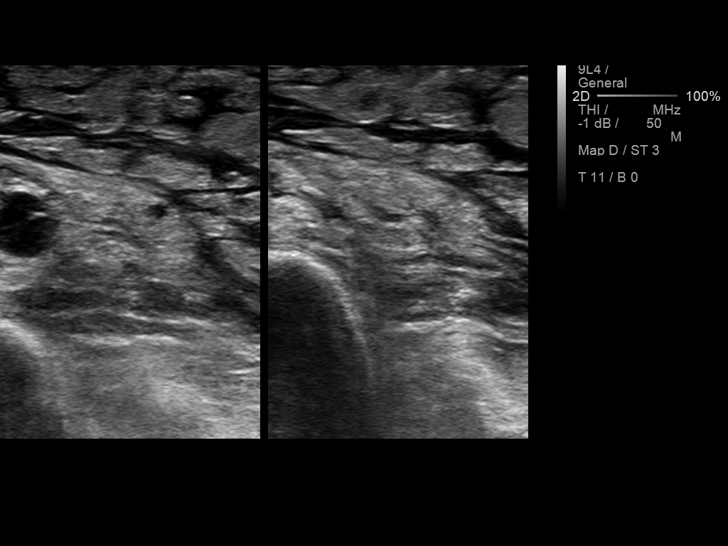
[im 21/30]
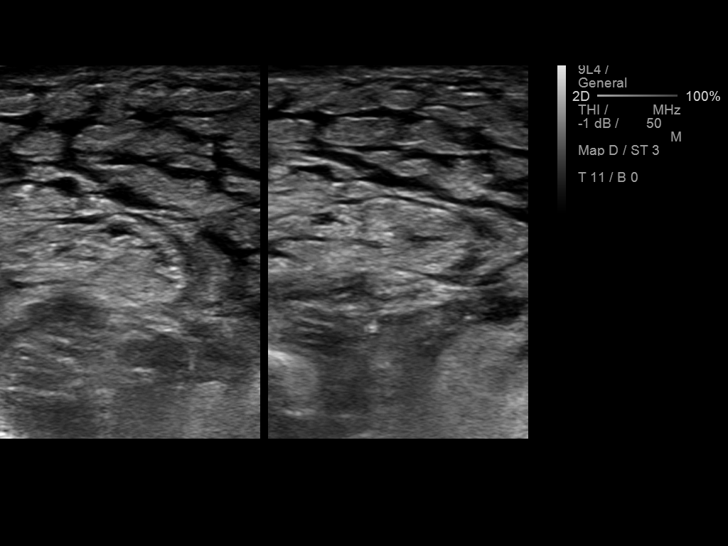
[im 23/30]
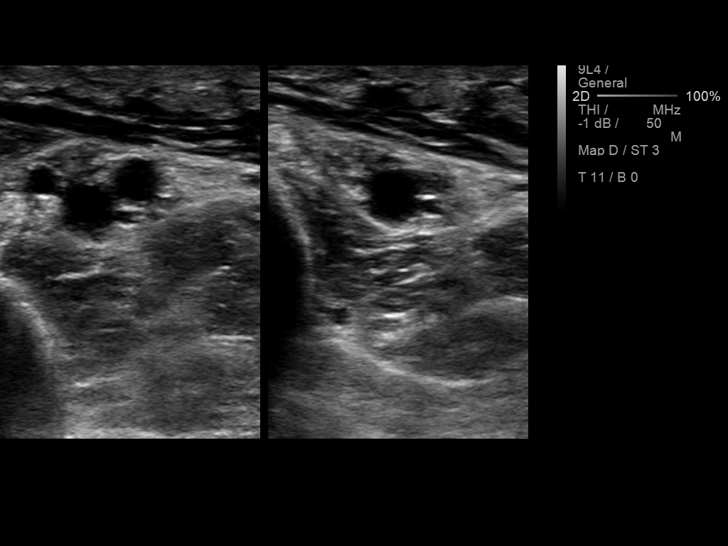
[im 24/30]
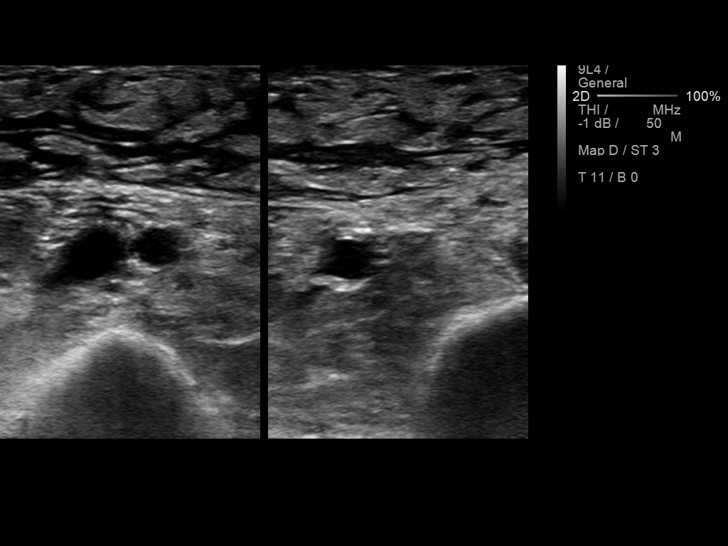
[im 27/30]
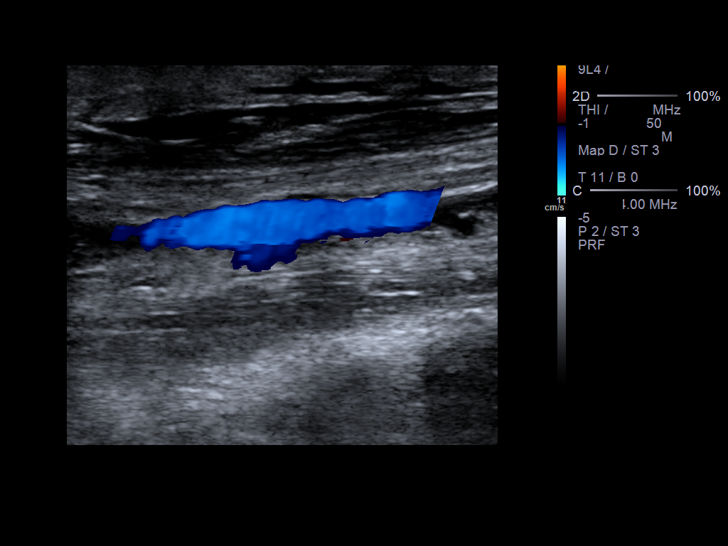
[im 30/30]
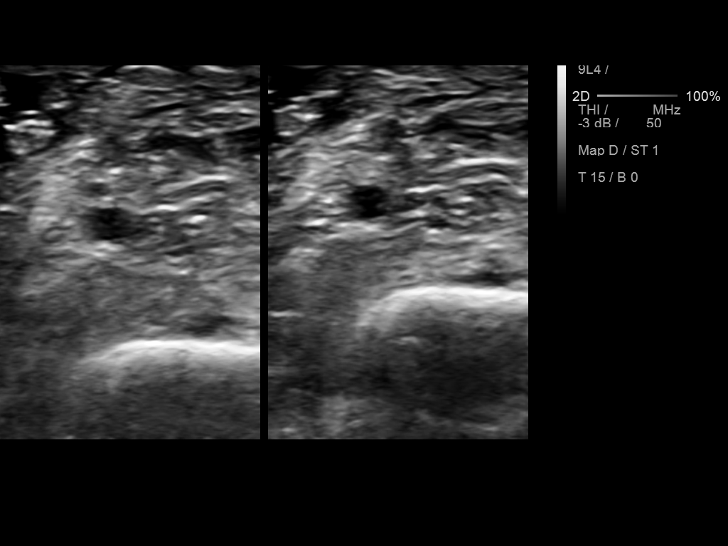

[14 of 24 positions shown; findings below may reference images not displayed]

FINDINGS: Left subclavian vein is widely patent by color flow imaging. There
is a normal-appearing venous waveform

There is complete compressibility of the left internal jugular,
axillary, brachial, radial, and ulnar veins. Doppler analysis
demonstrates phasicity and augmentation. No obvious superficial vein
thrombosis.
IMPRESSION: No evidence of left upper extremity DVT.
